# Patient Record
Sex: Male | Born: 2000 | Hispanic: Yes | Marital: Single | Smoking: Never smoker
Health system: Southern US, Community
[De-identification: ages and names within clinical notes are randomized; demographics above are authoritative.]

## PROBLEM LIST (undated history)

## (undated) DIAGNOSIS — T783XXA Angioneurotic edema, initial encounter: Secondary | ICD-10-CM

## (undated) DIAGNOSIS — L309 Dermatitis, unspecified: Secondary | ICD-10-CM

## (undated) DIAGNOSIS — E669 Obesity, unspecified: Secondary | ICD-10-CM

## (undated) HISTORY — DX: Dermatitis, unspecified: L30.9

## (undated) HISTORY — DX: Angioneurotic edema, initial encounter: T78.3XXA

## (undated) HISTORY — DX: Obesity, unspecified: E66.9

---
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
## 2021-06-06 ENCOUNTER — Other Ambulatory Visit: Payer: Self-pay

## 2021-06-06 ENCOUNTER — Encounter: Payer: Self-pay | Admitting: Pediatrics

## 2021-06-06 ENCOUNTER — Other Ambulatory Visit (HOSPITAL_COMMUNITY)
Admission: RE | Admit: 2021-06-06 | Discharge: 2021-06-06 | Disposition: A | Discharge status: Home / Self Care | Payer: Medicaid Other | Source: Ambulatory Visit | Attending: Pediatrics | Admitting: Pediatrics

## 2021-06-06 ENCOUNTER — Ambulatory Visit (INDEPENDENT_AMBULATORY_CARE_PROVIDER_SITE_OTHER): Payer: Medicaid Other | Admitting: Pediatrics

## 2021-06-06 VITALS — BP 118/64 | Ht 69.92 in | Wt 260.0 lb

## 2021-06-06 DIAGNOSIS — R7309 Other abnormal glucose: Secondary | ICD-10-CM | POA: Diagnosis not present

## 2021-06-06 DIAGNOSIS — Z113 Encounter for screening for infections with a predominantly sexual mode of transmission: Secondary | ICD-10-CM

## 2021-06-06 DIAGNOSIS — E785 Hyperlipidemia, unspecified: Secondary | ICD-10-CM | POA: Diagnosis not present

## 2021-06-06 DIAGNOSIS — Z6837 Body mass index (BMI) 37.0-37.9, adult: Secondary | ICD-10-CM | POA: Diagnosis not present

## 2021-06-06 DIAGNOSIS — Z0000 Encounter for general adult medical examination without abnormal findings: Secondary | ICD-10-CM | POA: Diagnosis not present

## 2021-06-06 DIAGNOSIS — Z68.41 Body mass index (BMI) pediatric, greater than or equal to 95th percentile for age: Secondary | ICD-10-CM | POA: Diagnosis not present

## 2021-06-06 LAB — POCT RAPID HIV: Rapid HIV, POC: NEGATIVE

## 2021-06-06 NOTE — Progress Notes (Signed)
Adolescent Well Care Visit Daniel Bean is a 20 y.o. male who is here for well care.     PCP:  Daniel Osgood, MD   History was provided by the patient and mother.  Confidentiality was discussed with the patient and, if applicable, with caregiver as well. Patient's personal or confidential phone number:    Current Issues: Current concerns include .   Has cut back on portion sizes  Working Holiday representative now  No ongoing issues with pilonidal cyst  Nutrition: Nutrition/Eating Behaviors: as above - home cooked foods, some fruits and vegetables Adequate calcium in diet?: yes Supplements/ Vitamins: none  Exercise/ Media: Play any Sports?:  none Exercise:   works Holiday representative Screen Time:  < 2 hours Media Rules or Monitoring?: no  Sleep:  Sleep: adequate  Social Screening: Lives with:  parents Parental relations:  good Activities, Work, and Theme park manager Concerns regarding behavior with peers?  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  no Secondhand smoke exposure?  no Drugs/ETOH?  no  Sexually Active?  no   Pregnancy Prevention: none  Safe at home, in school & in relationships?  Yes Safe to self?  Yes   Screenings:  The patient completed the Rapid Assessment for Adolescent Preventive Services screening questionnaire and the following topics were identified as risk factors and discussed: healthy eating and exercise  In addition, the following topics were discussed as part of anticipatory guidance healthy eating, exercise, and social isolation.  PHQ-9 completed and results indicated no concerns  Physical Exam:  Vitals:   06/06/21 1013 06/06/21 1052  BP: 132/76 118/64  Weight: 260 lb (117.9 kg)   Height: 5' 9.92" (1.776 m)    BP 118/64 (BP Location: Right Arm, Patient Position: Sitting)   Ht 5' 9.92" (1.776 m)    Wt 260 lb (117.9 kg)   BMI 37.39 kg/m  Body mass index: body mass index is 37.39 kg/m. Blood pressure percentiles are not available for patients who are 18 years or older.  Hearing Screening  Method: Audiometry   500Hz  1000Hz  2000Hz  4000Hz   Right ear 20 20 20 20   Left ear 20 20 20 20    Vision Screening   Right eye Left eye Both eyes  Without correction 20/20 20/20 20/202  With correction       Physical Exam Vitals and nursing note reviewed.  Constitutional:      General: He is not in acute distress.    Appearance: He is well-developed.  HENT:     Head: Normocephalic.     Right Ear: External ear normal.     Left Ear: External ear normal.     Nose: Nose normal.     Mouth/Throat:     Pharynx: No oropharyngeal exudate.  Eyes:     Conjunctiva/sclera: Conjunctivae normal.     Pupils: Pupils are equal, round, and reactive to light.  Neck:     Thyroid: No thyromegaly.  Cardiovascular:     Rate and Rhythm: Normal rate.     Heart sounds: Normal heart sounds. No murmur heard. Pulmonary:     Effort: Pulmonary effort is normal.     Breath sounds: Normal breath sounds.  Abdominal:     General: Bowel sounds are normal.     Palpations: Abdomen is soft. There is no mass.     Tenderness: There is no abdominal tenderness.  Hernia: There is no hernia in the left inguinal area.  Genitourinary:    Penis: Normal.      Testes: Normal.        Right: Mass not present. Right testis is descended.        Left: Mass not present. Left testis is descended.  Musculoskeletal:        General: Normal range of motion.     Cervical back: Normal range of motion and neck supple.  Lymphadenopathy:     Cervical: No cervical adenopathy.  Skin:    General: Skin is warm and dry.     Findings: No rash.  Neurological:     Mental Status: He is alert and oriented to person, place, and time.     Cranial Nerves: No cranial nerve deficit.     Assessment and Plan:   1. Encounter for general adult  medical examination without abnormal findings Now 20 years old and needs to transfer to adult care - Ambulatory referral to [facility]  2. Screening examination for venereal disease - POCT Rapid HIV  3. Routine screening for STI (sexually transmitted infection) - Urine cytology ancillary only  4. Body mass index, pediatric, greater than or equal to 95th percentile for age Stable BMI from last year - congratulated on positive chagnes made.  Blood pressure normal on my check today  Healthy habits reviewed.  Will repeat labs today to trend from last year - Hemoglobin A1c - Comprehensive metabolic panel - Lipid panel   BMI is not appropriate for age  Hearing screening result:normal Vision screening result: normal  Counseling provided for all of the vaccine components  Orders Placed This Encounter  Procedures   POCT Rapid HIV     No follow-ups on file.Dory Peru, MD

## 2021-06-07 LAB — COMPREHENSIVE METABOLIC PANEL
AG Ratio: 1.6 (calc) (ref 1.0–2.5)
ALT: 16 U/L (ref 8–46)
AST: 15 U/L (ref 12–32)
Albumin: 4.7 g/dL (ref 3.6–5.1)
Alkaline phosphatase (APISO): 52 U/L (ref 46–169)
BUN: 12 mg/dL (ref 7–20)
CO2: 26 mmol/L (ref 20–32)
Calcium: 9.8 mg/dL (ref 8.9–10.4)
Chloride: 103 mmol/L (ref 98–110)
Creat: 0.67 mg/dL (ref 0.60–1.24)
Globulin: 2.9 g/dL (calc) (ref 2.1–3.5)
Glucose, Bld: 84 mg/dL (ref 65–99)
Potassium: 4.2 mmol/L (ref 3.8–5.1)
Sodium: 138 mmol/L (ref 135–146)
Total Bilirubin: 1.3 mg/dL — ABNORMAL HIGH (ref 0.2–1.1)
Total Protein: 7.6 g/dL (ref 6.3–8.2)

## 2021-06-07 LAB — HEMOGLOBIN A1C
Hgb A1c MFr Bld: 4.9 % of total Hgb (ref <5.7)
Mean Plasma Glucose: 94 mg/dL
eAG (mmol/L): 5.2 mmol/L

## 2021-06-07 LAB — LIPID PANEL
Cholesterol: 185 mg/dL — ABNORMAL HIGH (ref <170)
HDL: 42 mg/dL — ABNORMAL LOW (ref >45)
LDL Cholesterol (Calc): 121 mg/dL (calc) — ABNORMAL HIGH (ref <110)
Non-HDL Cholesterol (Calc): 143 mg/dL (calc) — ABNORMAL HIGH (ref <120)
Total CHOL/HDL Ratio: 4.4 (calc) (ref <5.0)
Triglycerides: 109 mg/dL — ABNORMAL HIGH (ref <90)

## 2021-06-07 LAB — URINE CYTOLOGY ANCILLARY ONLY
Chlamydia: NEGATIVE
Comment: NEGATIVE
Comment: NORMAL
Neisseria Gonorrhea: NEGATIVE

## 2021-07-02 ENCOUNTER — Other Ambulatory Visit: Payer: Self-pay

## 2021-07-02 ENCOUNTER — Encounter (HOSPITAL_COMMUNITY): Payer: Self-pay | Admitting: Emergency Medicine

## 2021-07-02 ENCOUNTER — Ambulatory Visit (HOSPITAL_COMMUNITY)
Admission: EM | Admit: 2021-07-02 | Discharge: 2021-07-02 | Disposition: A | Discharge status: Home / Self Care | Payer: Medicaid Other | Attending: Student | Admitting: Student

## 2021-07-02 DIAGNOSIS — M5440 Lumbago with sciatica, unspecified side: Secondary | ICD-10-CM

## 2021-07-02 DIAGNOSIS — M5441 Lumbago with sciatica, right side: Secondary | ICD-10-CM

## 2021-07-02 MED ORDER — PREDNISONE 20 MG PO TABS: 40.0000 mg | ORAL_TABLET | Freq: Every day | ORAL | 0 refills | Status: AC

## 2021-07-02 MED ORDER — TIZANIDINE HCL 2 MG PO TABS: 2.0000 mg | ORAL_TABLET | Freq: Three times a day (TID) | ORAL | 0 refills | Status: DC | PRN

## 2021-07-02 NOTE — Discharge Instructions (Addendum)
-  Prednisone, 2 pills taken at the same time for 5 days in a row.  Try taking this earlier in the day as it can give you energy. Avoid NSAIDs like ibuprofen and alleve while taking this medication as they can increase your risk of stomach upset and even GI bleeding when in combination with a steroid. You can continue tylenol (acetaminophen) up to 1000mg 3x daily. -Start the muscle relaxer-Zanaflex (tizanidine), up to 3 times daily for muscle spasms and pain.  This can make you drowsy, so take at bedtime or when you do not need to drive or operate machinery. -Heating pad, rest 

## 2021-07-02 NOTE — ED Provider Notes (Signed)
[facility]    CSN: [id number] Arrival date & time: 07/02/21  0941      History   Chief Complaint Chief Complaint  Patient presents with   Back Pain   Leg Pain    Right     HPI Daniel Bean is a 20 y.o. male presenting with back pain and sciatica.  Medical history noncontributory.  Here today with mom.  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  Pain started following this.  Pain is right sided with pain shooting down the back of his leg with flexion of the spine. Denies numbness in arms/legs, denies weakness in arms/legs, denies saddle anesthesia, denies bowel/bladder incontinence, denies urinary retention, denies constipation.   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 difficile enteritis 07/27/2015   Eczema of both hands 03/21/2015   Obesity, unspecified 10/20/2013    History reviewed. No pertinent surgical history.     Home Medications    Prior to Admission medications   Medication Sig Start Date End Date Taking? Authorizing Provider  predniSONE (DELTASONE) 20 MG tablet Take 2 tablets (40 mg total) by mouth daily for 5 days. Take with breakfast or lunch. Avoid NSAIDs (ibuprofen, etc) while taking this medication. 07/02/21 07/07/21 Yes Rhys Martini, PA-C  tiZANidine (ZANAFLEX) 2 MG tablet Take 1 tablet (2 mg total) by mouth every 8 (eight) hours as needed for muscle spasms. 07/02/21  Yes Rhys Martini, PA-C    Family History Family History  Problem Relation Age of Onset    Allergic rhinitis Neg Hx    Angioedema Neg Hx    Asthma Neg Hx    Eczema Neg Hx    Immunodeficiency Neg Hx    Urticaria Neg Hx     Social History Social History   Tobacco Use   Smoking status: Never   Smokeless tobacco: Never  Substance Use Topics   Alcohol use: No   Drug use: No     Allergies   Patient has no known allergies.   Review of Systems Review of Systems  Constitutional:  Negative for chills, fever and unexpected weight change.  Respiratory:  Negative for chest tightness and shortness of breath.   Cardiovascular:  Negative for chest pain and palpitations.  Gastrointestinal:  Negative for abdominal pain, diarrhea, nausea and vomiting.  Genitourinary:  Negative for decreased urine volume, difficulty urinating and frequency.  Musculoskeletal:  Positive for back pain. Negative for arthralgias, gait problem, joint swelling, myalgias, neck pain and neck stiffness.  Skin:  Negative for wound.  Neurological:  Negative for dizziness, tremors, seizures, syncope, facial asymmetry, speech difficulty, weakness, light-headedness, numbness and headaches.  All other systems reviewed and are negative.   Physical Exam Triage Vital Signs ED Triage Vitals  Enc Vitals Group     BP 07/02/21 1100 136/79     Pulse Rate 07/02/21 1100 94     Resp 07/02/21 1100 18     Temp 07/02/21 1100 98.3 F (36.8 C)     Temp  src --      SpO2 07/02/21 1100 99 %     Weight --      Height --      Head Circumference --      Peak Flow --      Pain Score 07/02/21 1058 10     Pain Loc --      Pain Edu? --      Excl. in GC? --    No data found.  Updated Vital Signs BP 136/79   Pulse 94   Temp 98.3 F (36.8 C)   Resp 18   SpO2 99%   Visual Acuity Right Eye Distance:   Left Eye Distance:   Bilateral Distance:    Right Eye Near:   Left Eye Near:    Bilateral Near:     Physical Exam Vitals reviewed.  Constitutional:      General: He is not in acute distress.    Appearance:  Normal appearance. He is not ill-appearing.  HENT:     Head: Normocephalic and atraumatic.  Cardiovascular:     Rate and Rhythm: Normal rate and regular rhythm.     Heart sounds: Normal heart sounds.  Pulmonary:     Effort: Pulmonary effort is normal.     Breath sounds: Normal breath sounds and air entry.  Abdominal:     Tenderness: There is no abdominal tenderness. There is no right CVA tenderness, left CVA tenderness, guarding or rebound.  Musculoskeletal:     Cervical back: Normal range of motion. No swelling, deformity, signs of trauma, rigidity, spasms, tenderness, bony tenderness or crepitus. No pain with movement.     Thoracic back: No swelling, deformity, signs of trauma, spasms, tenderness or bony tenderness. Normal range of motion. No scoliosis.     Lumbar back: No swelling, deformity, signs of trauma, spasms, tenderness or bony tenderness. Normal range of motion. Negative right straight leg raise test and negative left straight leg raise test. No scoliosis.     Comments: Right-sided lumbar paraspinous muscle tenderness to palpation, pain elicited with flexion lumbar spine.  No cervical or thoracic paraspinous muscle tenderness.  Strength and sensation intact upper and lower extremities, no saddle anesthesia.  Gait intact. No midline spinous tenderness, deformity, stepoff.  Absolutely no other injury, deformity, tenderness, ecchymosis, abrasion.  Neurological:     General: No focal deficit present.     Mental Status: He is alert.     Cranial Nerves: No cranial nerve deficit.  Psychiatric:        Mood and Affect: Mood normal.        Behavior: Behavior normal.        Thought Content: Thought content normal.        Judgment: Judgment normal.     UC Treatments / Results  Labs (all labs ordered are listed, but only abnormal results are displayed) Labs Reviewed - No data to display  EKG   Radiology No results found.  Procedures Procedures (including critical care  time)  Medications Ordered in UC Medications - No data to display  Initial Impression / Assessment and Plan / UC Course  I have reviewed the triage vital signs and the nursing notes.  Pertinent labs & imaging results that were available during my care of the patient were reviewed by me and considered in my medical decision making (see chart for details).     This patient is a very pleasant 20 y.o. year old male presenting with lumbar strain with sciatica. No  red flag symptoms.   Trial of zanaflex, prednisone, heat, ROM exercises.   ED return precautions discussed. Patient and Mom verbalizes understanding and agreement.    Final Clinical Impressions(s) / UC Diagnoses   Final diagnoses:  Back pain of lumbar region with sciatica     Discharge Instructions      -Prednisone, 2 pills taken at the same time for 5 days in a row.  Try taking this earlier in the day as it can give you energy. Avoid NSAIDs like ibuprofen and alleve while taking this medication as they can increase your risk of stomach upset and even GI bleeding when in combination with a steroid. You can continue tylenol (acetaminophen) up to 1000mg  3x daily. -Start the muscle relaxer-Zanaflex (tizanidine), up to 3 times daily for muscle spasms and pain.  This can make you drowsy, so take at bedtime or when you do not need to drive or operate machinery. -Heating pad, rest   ED Prescriptions     Medication Sig Dispense Auth. Provider   predniSONE (DELTASONE) 20 MG tablet Take 2 tablets (40 mg total) by mouth daily for 5 days. Take with breakfast or lunch. Avoid NSAIDs (ibuprofen, etc) while taking this medication. 10 tablet , PA-C   tiZANidine (ZANAFLEX) 2 MG tablet Take 1 tablet (2 mg total) by mouth every 8 (eight) hours as needed for muscle spasms. 21 tablet Rhys Martini, PA-C      PDMP not reviewed this encounter.   Rhys Martini, PA-C 07/02/21 1201

## 2021-07-02 NOTE — ED Triage Notes (Signed)
Pt is present today with lower back pain and right leg pain. Pt states the pain is radiating from his lower back down his leg Pt states that the pain started x3 days ago

## 2021-08-08 ENCOUNTER — Ambulatory Visit (HOSPITAL_COMMUNITY)
Admission: EM | Admit: 2021-08-08 | Discharge: 2021-08-08 | Disposition: A | Discharge status: Home / Self Care | Payer: Medicaid Other | Attending: Emergency Medicine | Admitting: Emergency Medicine

## 2021-08-08 ENCOUNTER — Encounter (HOSPITAL_COMMUNITY): Payer: Self-pay | Admitting: Emergency Medicine

## 2021-08-08 ENCOUNTER — Other Ambulatory Visit: Payer: Self-pay

## 2021-08-08 DIAGNOSIS — M5416 Radiculopathy, lumbar region: Secondary | ICD-10-CM | POA: Diagnosis not present

## 2021-08-08 MED ORDER — PREDNISONE 10 MG (21) PO TBPK: ORAL_TABLET | Freq: Every day | ORAL | 0 refills | Status: AC

## 2021-08-08 MED ORDER — METHOCARBAMOL 500 MG PO TABS: 500.0000 mg | ORAL_TABLET | Freq: Every day | ORAL | 0 refills | Status: AC

## 2021-08-08 NOTE — ED Triage Notes (Signed)
Pt is present today with right leg pain. Pt states pain started x7 weeks ago. Pt denies any injury

## 2021-08-08 NOTE — ED Provider Notes (Signed)
[facility]  ____________________________________________  Time seen: Approximately 10:57 AM  I have reviewed the triage vital signs and the nursing notes.   HISTORY  Chief Complaint Leg Pain   Historian Patient     HPI Daniel Bean is a 20 y.o. male presents to the emergency department right lateral leg pain.  Pain radiates from the right side of her low back.  No bowel or bladder incontinence or saddle Anesthesia. patient has been able to stand and ambulate easily.  No fever or chills at home.  No falls or mechanisms of trauma.   Past Medical History:  Diagnosis Date   Angioedema 04/07/2016   Eczema    Obesity      Immunizations up to date:  Yes.     Past Medical History:  Diagnosis Date   Angioedema 04/07/2016   Eczema    Obesity     Patient Active Problem List   Diagnosis Date Noted   Leg pain, posterior, left 05/10/2020   Hypertension 08/18/2018   Pilonidal disease 08/18/2018   Angioedema 04/07/2016   Perennial and seasonal allergic rhinitis 04/07/2016   Seasonal allergic conjunctivitis 04/07/2016   Atopic dermatitis 04/07/2016   Vitamin D deficiency 01/29/2016   Elevated blood pressure reading 01/29/2016   C. difficile enteritis 07/27/2015   Eczema of both hands 03/21/2015   Obesity, unspecified 10/20/2013    History reviewed. No pertinent surgical history.  Prior to Admission medications   Medication Sig Start Date End Date Taking? Authorizing Provider  methocarbamol (ROBAXIN) 500 MG tablet Take 1 tablet (500 mg total) by mouth daily for 7 days. 08/08/21 08/15/21 Yes Pia Mau M, PA-C  predniSONE (STERAPRED UNI-PAK 21 TAB) 10 MG (21) TBPK tablet Take by mouth daily for 6 days. 6,5,4,3,2,1 08/08/21 08/14/21 Yes Pia Mau M, PA-C  tiZANidine (ZANAFLEX) 2 MG tablet Take 1 tablet (2 mg total) by mouth every 8 (eight) hours as needed for muscle spasms. 07/02/21   Rhys Martini, PA-C    Allergies Patient has no known  allergies.  Family History  Problem Relation Age of Onset   Allergic rhinitis Neg Hx    Angioedema Neg Hx    Asthma Neg Hx    Eczema Neg Hx    Immunodeficiency Neg Hx    Urticaria Neg Hx     Social History Social History   Tobacco Use   Smoking status: Never   Smokeless tobacco: Never  Substance Use Topics   Alcohol use: No   Drug use: No     Review of Systems  Constitutional: No fever/chills Eyes:  No discharge ENT: No upper respiratory complaints. Respiratory: no cough. No SOB/ use of accessory muscles to breath Gastrointestinal:   No nausea, no vomiting.  No diarrhea.  No constipation. Musculoskeletal: Patient has right leg pain.  Skin: Negative for rash, abrasions, lacerations, ecchymosis.    ____________________________________________   PHYSICAL EXAM:  VITAL SIGNS: ED Triage Vitals  Enc Vitals Group     BP 08/08/21 1033 125/75     Pulse Rate 08/08/21 1033 88     Resp 08/08/21 1033 18     Temp 08/08/21 1033 98.1 F (36.7 C)     Temp src --      SpO2 08/08/21 1033 98 %     Weight --      Height --      Head Circumference --      Peak Flow --      Pain Score 08/08/21 1032 7  Pain Loc --      Pain Edu? --      Excl. in GC? --      Constitutional: Alert and oriented. Well appearing and in no acute distress. Eyes: Conjunctivae are normal. PERRL. EOMI. Head: Atraumatic. ENT: Cardiovascular: Normal rate, regular rhythm. Normal S1 and S2.  Good peripheral circulation. Respiratory: Normal respiratory effort without tachypnea or retractions. Lungs CTAB. Good air entry to the bases with no decreased or absent breath sounds Gastrointestinal: Bowel sounds x 4 quadrants. Soft and nontender to palpation. No guarding or rigidity. No distention. Musculoskeletal: Full range of motion to all extremities. No obvious deformities noted.  Positive straight leg raise on the right. Neurologic:  Normal for age. No gross focal neurologic deficits are appreciated.   Skin:  Skin is warm, dry and intact. No rash noted. Psychiatric: Mood and affect are normal for age. Speech and behavior are normal.   ____________________________________________   LABS (all labs ordered are listed, but only abnormal results are displayed)  Labs Reviewed - No data to display ____________________________________________  EKG   ____________________________________________  RADIOLOGY   No results found.  ____________________________________________    PROCEDURES  Procedure(s) performed:     Procedures     Medications - No data to display   ____________________________________________   INITIAL IMPRESSION / ASSESSMENT AND PLAN / ED COURSE  Pertinent labs & imaging results that were available during my care of the patient were reviewed by me and considered in my medical decision making (see chart for details).      Assessment and plan Lumbar radiculopathy 20 year old male presents to the urgent care with paresthesias of the right lower extremity concerning for lumbar radiculopathy.  Patient was able to ambulate easily.  Physical exam.  We will start patient on tapered prednisone and Robaxin at night before bed.  Return precautions were given to return with new or worsening symptoms.   ____________________________________________  FINAL CLINICAL IMPRESSION(S) / ED DIAGNOSES  Final diagnoses:  Lumbar radiculopathy      NEW MEDICATIONS STARTED DURING THIS VISIT:  ED Discharge Orders          Ordered    predniSONE (STERAPRED UNI-PAK 21 TAB) 10 MG (21) TBPK tablet  Daily        08/08/21 1055    methocarbamol (ROBAXIN) 500 MG tablet  Daily        08/08/21 1055                This chart was dictated using voice recognition software/Dragon. Despite best efforts to proofread, errors can occur which can change the meaning. Any change was purely unintentional.     Orvil Feil, PA-C 08/08/21 1100

## 2021-08-08 NOTE — Discharge Instructions (Addendum)
Take tapered prednisone as directed. Take Robaxin once nightly before bed.

## 2021-09-11 ENCOUNTER — Ambulatory Visit (INDEPENDENT_AMBULATORY_CARE_PROVIDER_SITE_OTHER): Payer: Medicaid Other | Admitting: Pediatrics

## 2021-09-11 ENCOUNTER — Ambulatory Visit (HOSPITAL_COMMUNITY)
Admission: EM | Admit: 2021-09-11 | Discharge: 2021-09-11 | Discharge status: Against Medical Advice | Payer: Medicaid Other

## 2021-09-11 ENCOUNTER — Encounter: Payer: Self-pay | Admitting: Pediatrics

## 2021-09-11 ENCOUNTER — Other Ambulatory Visit: Payer: Self-pay

## 2021-09-11 VITALS — Ht 69.76 in | Wt 261.2 lb

## 2021-09-11 DIAGNOSIS — M5416 Radiculopathy, lumbar region: Secondary | ICD-10-CM

## 2021-09-11 MED ORDER — METHOCARBAMOL 500 MG PO TABS: 500.0000 mg | ORAL_TABLET | Freq: Every day | ORAL | 0 refills | Status: AC

## 2021-09-11 NOTE — Progress Notes (Signed)
PCP: Pcp, No   Chief Complaint  Patient presents with   SAME DAY    LEG PAIN FOR 3-4 MONTHS; RT LEG.        Subjective:  HPI:  Daniel Bean is a 20 y.o. male presenting today for ongoing right sided leg pain. The pain has been constant for the last 13 weeks. It is sharp and sometimes feels numb. Occasionally feels like pins and needles. Right sided weakness. It starts in his hip and radiates to his ankle. Muscle relaxers are the only thing that have helped the pain. Tylenol, Motrin, Icy hot did not help. Sitting and laying down makes it worse. Increased activity makes it worse. Standing up makes it better unless he has to stand for long periods, then it worsens. Improves if he leans forward. Endorses some lower back pain that radiates to his hip. He first noticed the pain after lifting a heavy object. No longer lifting heavy objects. Has been using muscle pain creams and OTC cramp control medicine. The pain is similar to the pain in his left leg when he injured his hamstring last year, only it never improved.   Denies numbness in arms, denies weakness in arms, denies saddle anesthesia, denies bowel/bladder incontinence, denies urinary retention, denies constipation.   REVIEW OF SYSTEMS:  All others negative except otherwise noted above in HPI.    Meds: Current Outpatient Medications  Medication Sig Dispense Refill   methocarbamol (ROBAXIN) 500 MG tablet Take 1 tablet (500 mg total) by mouth daily. 30 tablet 0   tiZANidine (ZANAFLEX) 2 MG tablet Take 1 tablet (2 mg total) by mouth every 8 (eight) hours as needed for muscle spasms. 21 tablet 0   No current facility-administered medications for this visit.    ALLERGIES: No Known Allergies  PMH:  Past Medical History:  Diagnosis Date   Angioedema 04/07/2016   Eczema    Obesity     PSH: History reviewed. No pertinent surgical history.  Social history:  Social History   Social History Narrative   Lives with parents. 11th  grade at [employer]    Family history: Family History  Problem Relation Age of Onset   Allergic rhinitis Neg Hx    Angioedema Neg Hx    Asthma Neg Hx    Eczema Neg Hx    Immunodeficiency Neg Hx    Urticaria Neg Hx      Objective:   Physical Examination:  BP:   (Blood pressure percentiles are not available for patients who are 18 years or older.)  Wt: 261 lb 3.2 oz (118.5 kg)  Ht: 5' 9.76" (1.772 m)  BMI: Body mass index is 37.73 kg/m. (>99 %ile (Z= 2.47) based on CDC (Boys, 2-20 Years) BMI-for-age based on BMI available as of 06/06/2021 from contact on 06/06/2021.) GENERAL: Well appearing, no distress, sitting comfortably on exam table HEENT: NCAT, clear sclerae, no nasal discharge, no tonsillary erythema or exudate, MMM NECK: Supple, no cervical LAD LUNGS: EWOB, CTAB, no wheeze, no crackles CARDIO: RRR, normal S1S2 no murmur, well perfused ABDOMEN: Normoactive bowel sounds, soft, ND/NT, no masses or organomegaly EXTREMITIES: Warm and well perfused, no deformity MSK: full active and passive ROM bilateral upper and lower extremities, 5/5 strength bilateral UE/LE, pain with extension of right lower extremity, pain with hip flexion, pain with right external rotation of hip. NEURO: Awake, alert, interactive, normal strength, tone, and gait. Decreased sensation right leg compared to left leg.  SKIN: No rash, ecchymosis or petechiae  Assessment/Plan:   Daniel Bean is a 20 y.o. old male here for ongoing right lower extremity pain x 13 weeks. He has been seen at urgent care multiple times with normal RLE xray and short course of steroid and muscle relaxant. Muscle relaxer is the only thing that has helped with the pain. He has pain through multiple range of motions of his RLE, although has FROM active and passive. Strength 5/5. Exam and history consistent with right lumbar radiculopathy. Given chronic nature of his pain without improvement, will refer to orthopedics for further evaluation and  potential imaging. Will give short course of Robaxin for pain management in the interim.   1. Lumbar radiculopathy, right - Supportive care discussed  - Ambulatory referral to Orthopedics - methocarbamol (ROBAXIN) 500 MG tablet; Take 1 tablet (500 mg total) by mouth daily.  Dispense: 30 tablet; Refill: 0  - Reasons to seek emergency care discussed   Follow up: Return if symptoms worsen or fail to improve.

## 2021-09-16 ENCOUNTER — Ambulatory Visit (INDEPENDENT_AMBULATORY_CARE_PROVIDER_SITE_OTHER): Payer: Medicaid Other | Admitting: Physician Assistant

## 2021-09-16 ENCOUNTER — Encounter: Payer: Self-pay | Admitting: Physician Assistant

## 2021-09-16 ENCOUNTER — Ambulatory Visit (INDEPENDENT_AMBULATORY_CARE_PROVIDER_SITE_OTHER): Payer: Medicaid Other

## 2021-09-16 DIAGNOSIS — M545 Low back pain, unspecified: Secondary | ICD-10-CM

## 2021-09-16 DIAGNOSIS — G8929 Other chronic pain: Secondary | ICD-10-CM

## 2021-09-16 NOTE — Progress Notes (Signed)
Office Visit Note   Patient: Daniel Bean           Date of Birth: 06-17-2001           MRN: [id number] Visit Date: 09/16/2021              Requested by: Marijo File, MD [address] [place],  [place] [address] PCP: Pcp, No  Chief Complaint  Patient presents with   Lower Back - Pain      HPI: Patient is a pleasant 20 year old gentleman with a 75-month history of lower back pain radiating down into his right buttock and leg with associated tingling and numbness.  He denies any loss of bowel or bladder control.  He relates this to possibly an injury of lifting about 25 pounds in July.  He has had physical therapy which is helped but he admits he has not continued with the exercises.  He also had a steroid taper which seemed to help.  Denies any fever or chills.  Denies any weakness  Assessment & Plan: Visit Diagnoses:  1. Chronic right-sided low back pain, unspecified whether sciatica present     Plan: Findings consistent with a lumbar radiculopathy.  X-rays are reassuring.  Given this is been going on for 6 months and has had both physical therapy and a steroid taper with the return of symptoms I recommended an MRI.  He is asking if he can try more physical therapy as well and is I placed an order for this as well  Follow-Up Instructions: No follow-ups on file.   Ortho Exam  Patient is alert, oriented, no adenopathy, well-dressed, normal affect, normal respiratory effort. Examination of his lower back he has no step-offs no specific spot of pain.  He is dictates his pain pattern is wrapping around his buttock going down his leg.  Strength is 5 out of 5 negative straight leg raise deep tendon reflexes are intact and equal  Imaging: XR Lumbar Spine 2-3 Views  Result Date: 09/16/2021 2 view lumbar spine demonstrates well-maintained spaces between the vertebrae.  Lordotic curve is without any listhesis and is without any scoliosis.  No acute osseous changes.   No images are attached to the encounter.  Labs: Lab Results  Component Value Date   HGBA1C 4.9 06/06/2021   HGBA1C 5.1 05/02/2020   HGBA1C 5.1 09/09/2018   ESRSEDRATE 5 10/12/2015   ESRSEDRATE 19 (H) 07/26/2015   CRP <0.5 10/12/2015   CRP 16.7 (H) 07/26/2015   CRP 14.8 (H) 07/24/2015   REPTSTATUS 07/30/2015 FINAL 07/26/2015   REPTSTATUS 07/27/2015 FINAL 07/26/2015   CULT  07/26/2015    NO SALMONELLA, SHIGELLA, CAMPYLOBACTER, YERSINIA, OR E.COLI 0157:H7 ISOLATED Performed at [facility] Results  Component Value Date   ALBUMIN 4.6 10/12/2015   ALBUMIN 2.6 (L) 07/25/2015   ALBUMIN 3.6 07/24/2015    No results found for: MG Lab Results  Component Value Date   VD25OH 24 (L) 04/03/2016   VD25OH 13 (L) 05/16/2015    No results found for: PREALBUMIN CBC EXTENDED Latest Ref Rng & Units 04/07/2016 10/12/2015 07/26/2015  WBC 4.5 - 13.0 K/uL 9.8 9.0 9.7  RBC 4.10 - 5.70 MIL/uL 4.80 4.85 3.60(L)  HGB 12.0 - 16.9 g/dL 58.5 14.8(H) 10.9(L)  HCT 36.0 - 49.0 % 43.0 44.1(H) 32.1(L)  PLT 140 - 400 K/uL 279 322 152  NEUTROABS 1,800 - 8,000 cells/uL 5,978 4.7 6.6  LYMPHSABS 1,200 - 5,200 cells/uL  3,136 3.6 1.9     There is no height or weight on file to calculate BMI.  Orders:  Orders Placed This Encounter  Procedures   XR Lumbar Spine 2-3 Views   MR Lumbar Spine w/o contrast   Ambulatory referral to Physical Therapy   No orders of the defined types were placed in this encounter.    Procedures: No procedures performed  Clinical Data: No additional findings.  ROS:  All other systems negative, except as noted in the HPI. Review of Systems  Objective: Vital Signs: There were no vitals taken for this visit.  Specialty Comments:  No specialty comments available.  PMFS History: Patient Active Problem List   Diagnosis Date Noted   Leg pain, posterior, left 05/10/2020   Hypertension 08/18/2018   Pilonidal disease 08/18/2018   Angioedema  04/07/2016   Perennial and seasonal allergic rhinitis 04/07/2016   Seasonal allergic conjunctivitis 04/07/2016   Atopic dermatitis 04/07/2016   Vitamin D deficiency 01/29/2016   Elevated blood pressure reading 01/29/2016   C. difficile enteritis 07/27/2015   Eczema of both hands 03/21/2015   Obesity, unspecified 10/20/2013   Past Medical History:  Diagnosis Date   Angioedema 04/07/2016   Eczema    Obesity     Family History  Problem Relation Age of Onset   Allergic rhinitis Neg Hx    Angioedema Neg Hx    Asthma Neg Hx    Eczema Neg Hx    Immunodeficiency Neg Hx    Urticaria Neg Hx     History reviewed. No pertinent surgical history. Social History   Occupational History   Not on file  Tobacco Use   Smoking status: Never   Smokeless tobacco: Never  Substance and Sexual Activity   Alcohol use: No   Drug use: No   Sexual activity: Never

## 2021-10-07 ENCOUNTER — Other Ambulatory Visit: Payer: Self-pay

## 2021-10-07 ENCOUNTER — Ambulatory Visit: Payer: Medicaid Other | Attending: Physician Assistant

## 2021-10-07 DIAGNOSIS — M545 Low back pain, unspecified: Secondary | ICD-10-CM | POA: Diagnosis not present

## 2021-10-07 DIAGNOSIS — M5126 Other intervertebral disc displacement, lumbar region: Secondary | ICD-10-CM | POA: Diagnosis present

## 2021-10-07 DIAGNOSIS — M5416 Radiculopathy, lumbar region: Secondary | ICD-10-CM | POA: Diagnosis present

## 2021-10-07 DIAGNOSIS — M6281 Muscle weakness (generalized): Secondary | ICD-10-CM | POA: Insufficient documentation

## 2021-10-07 DIAGNOSIS — G8929 Other chronic pain: Secondary | ICD-10-CM | POA: Insufficient documentation

## 2021-10-07 NOTE — Therapy (Addendum)
OUTPATIENT PHYSICAL THERAPY THORACOLUMBAR EVALUATION   Patient Name: Daniel Bean MRN: [id number] DOB:07/06/2001, 21 y.o., male Today's Date: 10/07/2021   PT End of Session - 10/07/21 1451     Visit Number 1    Number of Visits 4    Date for PT Re-Evaluation 11/04/21    Authorization Type MCD    PT Start Time 1445    PT Stop Time 1530    PT Time Calculation (min) 45 min    Activity Tolerance Patient tolerated treatment well    Behavior [facility] Therapy [facility] for tasks assessed/performed             Past Medical History:  Diagnosis Date   Angioedema 04/07/2016   Eczema    Obesity    History reviewed. No pertinent surgical history. Patient Active Problem List   Diagnosis Date Noted   Leg pain, posterior, left 05/10/2020   Hypertension 08/18/2018   Pilonidal disease 08/18/2018   Angioedema 04/07/2016   Perennial and seasonal allergic rhinitis 04/07/2016   Seasonal allergic conjunctivitis 04/07/2016   Atopic dermatitis 04/07/2016   Vitamin D deficiency 01/29/2016   Elevated blood pressure reading 01/29/2016   C. difficile enteritis 07/27/2015   Eczema of both hands 03/21/2015   Obesity, unspecified 10/20/2013    PCP: Pcp, No  REFERRING PROVIDER: Persons, [place], PA  REFERRING DIAG: R sided low back pain  THERAPY DIAG:  Muscle weakness (generalized)  Radiculopathy, lumbar region  Herniated lumbar intervertebral disc  ONSET DATE: July 2022  SUBJECTIVE:                                                                                                                                                                                           SUBJECTIVE STATEMENT: Reporting R side low back and radicular symptoms following lift incident in July 2022, has an MRI scheduled on 10/12/21, has had trial of steroidal dose back with some relief  PERTINENT HISTORY: Patient is a pleasant 21 year old gentleman with a 2176-month history of lower back pain radiating down into his  right buttock and leg with associated tingling and numbness.  He denies any loss of bowel or bladder control.  He relates this to possibly an injury of lifting about 25 pounds in July.  He has had physical therapy which is helped but he admits he has not continued with the exercises.  He also had a steroid taper which seemed to help.  Denies any fever or chills.  Denies any weakness    PAIN:  Are you having pain? Yes VAS scale: 8/10 Pain location: R hip Pain orientation: Right  PAIN TYPE: aching and burning  Pain description: intermittent  Aggravating factors: position changes Relieving factors: undetermined  PRECAUTIONS: None  WEIGHT BEARING RESTRICTIONS No  FALLS:  Has patient fallen in last 6 months? No, Number of falls: 0  LIVING ENVIRONMENT: Lives with: lives with their family Lives in: House/apartment   OCCUPATION: student  PLOF: Independent  PATIENT GOALS: Decrease and manage pain   OBJECTIVE:   DIAGNOSTIC FINDINGS:  MRI scheduled 10/12/21; Plan: Findings consistent with a lumbar radiculopathy.  X-rays are reassuring.  Given this is been going on for 6 months and has had both physical therapy and a steroid taper with the return of symptoms I recommended an MRI.  He is asking if he can try more physical therapy as well and is I placed an order for this as well   PATIENT SURVEYS:  MCD  SCREENING FOR RED FLAGS: Bowel or bladder incontinence: No   COGNITION:  Overall cognitive status: Within functional limits for tasks assessed     SENSATION:  Light touch: Appears intact   MUSCLE LENGTH: Hamstrings: Right 45 deg; Left 45 deg  POSTURE:  L lateral shift  PALPATION: unremarkable  LUMBARAROM/PROM  A/PROM A/PROM  10/07/2021  Flexion 33%  Extension 50%  Right lateral flexion 25%  Left lateral flexion 75%  Right rotation   Left rotation    (Blank rows = not tested)  LE AROM/PROM: BLE ROM WFL  A/PROM Right 10/07/2021 Left 10/07/2021  Hip flexion    Hip  extension    Hip abduction    Hip adduction    Hip internal rotation    Hip external rotation    Knee flexion    Knee extension    Ankle dorsiflexion    Ankle plantarflexion    Ankle inversion    Ankle eversion     (Blank rows = not tested)  LE MMT:  MMT Right 10/07/2021 Left 10/07/2021  Hip flexion    Hip extension    Hip abduction    Hip adduction    Hip internal rotation    Hip external rotation    Knee flexion    Knee extension    Ankle dorsiflexion 5 5  Ankle plantarflexion 5 5  Ankle inversion    Ankle eversion     (Blank rows = not tested)  LUMBAR SPECIAL TESTS:  Straight leg raise test: Positive and Slump test: Positive  FUNCTIONAL TESTS:  Not tested  GAIT: Distance walked: 200 Assistive device utilized: None Level of assistance: Complete Independence     TODAY'S TREATMENT  HEP and assessment    PATIENT EDUCATION:  Education details: Discussed eval findings, rehab rationale and POC and patient is in agreement  Person educated: Patient Education method: Handouts Education comprehension: verbalized understanding, returned demonstration, and needs further education   HOME EXERCISE PROGRAM: Access Code: [email or web address] URL: [email or web address] Date: 10/07/2021 Prepared by: Gustavus Bryant  Exercises Prone Press Up - 5 x daily - 7 x weekly - 1 sets - 10 reps Standing Lumbar Extension - 5 x daily - 7 x weekly - 1 sets - 10 reps   ASSESSMENT:  CLINICAL IMPRESSION: Patient is a 21 y.o. male who was seen today for physical therapy evaluation and treatment for R low back pain and radiculopathy.  He presents with a mild L lateral shift in standing and lumbar ROM mainly limited in flexion. Symptom reproduction with slump test and R SLR, symptoms centralized with prone pres and again with OP applied by PT. Objective impairments include Abnormal gait, decreased activity tolerance, decreased  knowledge of condition, decreased ROM, impaired  flexibility, and pain. These impairments are limiting patient from community activity, driving, and fitness . Personal factors including Age, Fitness, and Time since onset of injury/illness/exacerbation are also affecting patient's functional outcome. Patient will benefit from skilled PT to address above impairments and improve overall function.  REHAB POTENTIAL: Good  CLINICAL DECISION MAKING: Stable/uncomplicated  EVALUATION COMPLEXITY: Low   GOALS: Goals reviewed with patient? Yes  SHORT TERM GOALS:  STG Name Target Date Goal status  1 Patient to demonstrate independence in HEP Baseline: Access Code: [email or web address] 10/21/2021 INITIAL  LONG TERM GOALS:   LTG Name Target Date Goal status  1 Decrease pain to 2/10 Baseline: 8/10 11/04/2021 INITIAL  2 Increase AROM lumbar spine to 75% all planes Baseline: A/PROM A/PROM  10/07/2021  Flexion 33%  Extension 50%  Right lateral flexion 25%  Left lateral flexion 75%  Right rotation   Left rotation    11/04/2021 INITIAL  3 Patient to demo negative slump and SLR on R Baseline: positive signs on R 11/04/2021 INITIAL   PLAN: PT FREQUENCY: 1x/week  PT DURATION: 4 weeks  PLANNED INTERVENTIONS: Therapeutic exercises, Therapeutic activity, Neuro Muscular re-education, Balance training, Gait training, Patient/Family education, Joint mobilization, and Manual therapy  PLAN FOR NEXT SESSION: Review prone press and extension, hamstring flexibility, core strengthening   Hildred Laser PT 10/07/2021, 4:40 PM   Check all possible CPT codes: 30160- Therapeutic Exercise, [phone]- Neuro Re-education, [phone] - Gait Training, 97140 - Manual Therapy, 97530 - Therapeutic Activities, and 97535 - Self Care

## 2021-10-09 ENCOUNTER — Ambulatory Visit (INDEPENDENT_AMBULATORY_CARE_PROVIDER_SITE_OTHER): Payer: Medicaid Other | Admitting: Primary Care

## 2021-10-12 ENCOUNTER — Other Ambulatory Visit: Payer: Self-pay

## 2021-10-12 ENCOUNTER — Ambulatory Visit
Admission: RE | Admit: 2021-10-12 | Discharge: 2021-10-12 | Disposition: A | Discharge status: Home / Self Care | Payer: Medicaid Other | Source: Ambulatory Visit | Attending: Physician Assistant | Admitting: Physician Assistant

## 2021-10-12 DIAGNOSIS — G8929 Other chronic pain: Secondary | ICD-10-CM

## 2021-10-12 DIAGNOSIS — M5416 Radiculopathy, lumbar region: Secondary | ICD-10-CM | POA: Diagnosis not present

## 2021-10-12 DIAGNOSIS — M48061 Spinal stenosis, lumbar region without neurogenic claudication: Secondary | ICD-10-CM | POA: Diagnosis not present

## 2021-10-12 DIAGNOSIS — M5127 Other intervertebral disc displacement, lumbosacral region: Secondary | ICD-10-CM | POA: Diagnosis not present

## 2021-10-12 DIAGNOSIS — R29898 Other symptoms and signs involving the musculoskeletal system: Secondary | ICD-10-CM | POA: Diagnosis not present

## 2021-10-12 DIAGNOSIS — M545 Low back pain, unspecified: Secondary | ICD-10-CM | POA: Diagnosis not present

## 2021-10-16 ENCOUNTER — Encounter: Payer: Self-pay | Admitting: Orthopaedic Surgery

## 2021-10-16 ENCOUNTER — Ambulatory Visit (INDEPENDENT_AMBULATORY_CARE_PROVIDER_SITE_OTHER): Payer: Medicaid Other | Admitting: Orthopaedic Surgery

## 2021-10-16 DIAGNOSIS — G8929 Other chronic pain: Secondary | ICD-10-CM | POA: Diagnosis not present

## 2021-10-16 DIAGNOSIS — M5441 Lumbago with sciatica, right side: Secondary | ICD-10-CM | POA: Diagnosis not present

## 2021-10-16 DIAGNOSIS — M545 Low back pain, unspecified: Secondary | ICD-10-CM | POA: Insufficient documentation

## 2021-10-16 NOTE — Progress Notes (Signed)
Office Visit Note   Patient: Daniel Bean           Date of Birth: [birth year]           MRN: [id number] Visit Date: 10/16/2021              Requested by: No referring provider defined for this encounter. PCP: Pcp, No   Assessment & Plan: Visit Diagnoses:  1. Chronic right-sided low back pain, unspecified whether sciatica present   2. Chronic right-sided low back pain with right-sided sciatica     Plan: 21 year old young man with a chronic history of low back pain associated with right lower extremity radiculopathy.  Has been seen in the urgent care facility 3 times since October for his pain and once in the office approximately a month ago.  In addition of the back pain he has been experiencing right lower extremity numbness and tingling.  An MRI scan was obtained demonstrating moderate central canal stenosis with right greater than left subarticular and lateral recess narrowing at L4-5 secondary to a large right with disc protrusion.  There was mild foraminal narrowing bilaterally at L4-5.  A more shallow central disc protrusion at L5-S1 with potential contact of the transversing L1 nerve root bilaterally.  He does feel like he is improving a little with therapy but I think it is worth considering an epidural steroid injection given the size of the disc protrusion at L4-5.  He relates that in further history he was evaluated when living in [place] for similar problem and had a cortisone injection that "helps".  Neurologically appears to be intact.  But still has a positive straight leg raise on the right  Follow-Up Instructions: Return in about 1 month (around 11/16/2021).   Orders:  Orders Placed This Encounter  Procedures   Ambulatory referral to Physical Medicine Rehab   No orders of the defined types were placed in this encounter.     Procedures: No procedures performed   Clinical Data: No additional findings.   Subjective: Chief Complaint  Patient presents with    Lower Back - Follow-up    MRI review  Patient presents today for follow up on his lower back. He had an MRI and is here today for those results. Has been seen in the urgent care 3 times between October and December 2022 and seen by [facility] in the office on 19 December.  Is been going to physical therapy and feels like he is improving but still having pain in his back and his right lower extremity.  He denies any bowel or bladder dysfunction or or weakness of either lower extremity.  With further questioning he relates he had a similar problem in his back when living in [place] and had a back injection that helped  HPI  Review of Systems   Objective: Vital Signs: There were no vitals taken for this visit.  Physical Exam Constitutional:      Appearance: He is well-developed.  Eyes:     Pupils: Pupils are equal, round, and reactive to light.  Pulmonary:     Effort: Pulmonary effort is normal.  Skin:    General: Skin is warm and dry.  Neurological:     Mental Status: He is alert and oriented to person, place, and time.  Psychiatric:        Behavior: Behavior normal.    Ortho Exam awake alert and oriented x3 comfortable sitting.  Straight leg raise is positive on the right for leg and  back pain but negative on the left.  Motor and sensory exam appeared to be intact.  Painless range of motion of both hips  Specialty Comments:  No specialty comments available.  Imaging: No results found.   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 difficile enteritis 07/27/2015   Eczema of both hands 03/21/2015    Obesity, unspecified 10/20/2013   Past Medical History:  Diagnosis Date   Angioedema 04/07/2016   Eczema    Obesity     Family History  Problem Relation Age of Onset   Allergic rhinitis Neg Hx    Angioedema Neg Hx    Asthma Neg Hx    Eczema Neg Hx    Immunodeficiency Neg Hx    Urticaria Neg Hx     History reviewed. No pertinent surgical history. Social History   Occupational History   Not on file  Tobacco Use   Smoking status: Never   Smokeless tobacco: Never  Substance and Sexual Activity   Alcohol use: No   Drug use: No   Sexual activity: Never

## 2021-10-18 ENCOUNTER — Other Ambulatory Visit: Payer: Self-pay

## 2021-10-18 ENCOUNTER — Ambulatory Visit: Payer: Medicaid Other

## 2021-10-18 DIAGNOSIS — M6281 Muscle weakness (generalized): Secondary | ICD-10-CM | POA: Diagnosis not present

## 2021-10-18 DIAGNOSIS — M5126 Other intervertebral disc displacement, lumbar region: Secondary | ICD-10-CM

## 2021-10-18 DIAGNOSIS — M5416 Radiculopathy, lumbar region: Secondary | ICD-10-CM

## 2021-10-18 NOTE — Therapy (Signed)
OUTPATIENT PHYSICAL THERAPY TREATMENT NOTE   Patient Name: Daniel Bean MRN: [id number] DOB:10/05/2000, 21 y.o., male Today's Date: 10/18/2021  PCP: Oneita HurtPcp, No REFERRING PROVIDER: Persons, [place], [place]   PT End of Session - 10/18/21 0836     Visit Number 2    Number of Visits 4    Date for PT Re-Evaluation 11/04/21    Authorization Type MCD    PT Start Time 0835    PT Stop Time 0915    PT Time Calculation (min) 40 min    Activity Tolerance Patient tolerated treatment well    Behavior [facility] Therapy [facility] for tasks assessed/performed             Past Medical History:  Diagnosis Date   Angioedema 04/07/2016   Eczema    Obesity    History reviewed. No pertinent surgical history. 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 difficile enteritis 07/27/2015   Eczema of both hands 03/21/2015   Obesity, unspecified 10/20/2013    REFERRING DIAG: M54.50,G89.29 (ICD-10-CM) - Chronic right-sided low back pain, unspecified whether sciatica present   THERAPY DIAG:  Muscle weakness (generalized)  Radiculopathy, lumbar region  Herniated lumbar intervertebral disc  PERTINENT HISTORY: Patient is a pleasant 21 year old gentleman with a 4459-month history of lower back pain radiating down into his right buttock and leg with associated tingling and numbness.  He denies any loss of bowel or bladder control.  He relates this to possibly an injury of lifting about 25 pounds in July.  He has had physical therapy which is helped but he admits he has not continued with the exercises.  He also had a steroid taper which seemed to  help.  Denies any fever or chills.  Denies any weakness   PRECAUTIONS: HNP  SUBJECTIVE: RLE symptoms slightly elevated as of this morning  PAIN:  Are you having pain? Yes NPRS scale: 8/10 Pain location: RLE Pain orientation: Right  PAIN TYPE: burning and tingling Pain description: intermittent and stabbing  Aggravating factors: undetermined Relieving factors: position changes       OBJECTIVE:    DIAGNOSTIC FINDINGS:  MRI scheduled 10/12/21; Plan: Findings consistent with a lumbar radiculopathy.  X-rays are reassuring.  Given this is been going on for 6 months and has had both physical therapy and a steroid taper with the return of symptoms I recommended an MRI.  He is asking if he can try more physical therapy as well and is I placed an order for this as well   An MRI scan was obtained demonstrating moderate central canal stenosis with right greater than left subarticular and lateral recess narrowing at L4-5 secondary to a large right with disc protrusion.  There was mild foraminal narrowing bilaterally at L4-5.  A more shallow central disc protrusion at L5-S1 with potential contact of the transversing L1 nerve root bilaterally.   PATIENT SURVEYS:  N/a due to MCD   SCREENING FOR RED FLAGS: Bowel or bladder incontinence: No     COGNITION:          Overall cognitive status: Within functional limits for tasks assessed  SENSATION:          Light touch: Appears intact           MUSCLE LENGTH: Hamstrings: Right 45 deg; Left 45 deg   POSTURE:  L lateral shift   PALPATION: unremarkable   LUMBARAROM/PROM   A/PROM A/PROM  10/07/2021  Flexion 33%  Extension 50%  Right lateral flexion 25%  Left lateral flexion 75%  Right rotation    Left rotation     (Blank rows = not tested)   LE AROM/PROM: BLE ROM WFL   A/PROM Right 10/07/2021 Left 10/07/2021  Hip flexion      Hip extension      Hip abduction      Hip adduction      Hip internal rotation       Hip external rotation      Knee flexion      Knee extension      Ankle dorsiflexion      Ankle plantarflexion      Ankle inversion      Ankle eversion       (Blank rows = not tested)   LE MMT:   MMT Right 10/07/2021 Left 10/07/2021  Hip flexion      Hip extension      Hip abduction      Hip adduction      Hip internal rotation      Hip external rotation      Knee flexion      Knee extension      Ankle dorsiflexion 5 5  Ankle plantarflexion 5 5  Ankle inversion      Ankle eversion       (Blank rows = not tested)   LUMBAR SPECIAL TESTS:  Straight leg raise test: Positive and Slump test: Positive   FUNCTIONAL TESTS:  Not tested   GAIT: Distance walked: 200 Assistive device utilized: None Level of assistance: Complete Independence         TODAY'S TREATMENT  [facility] Adult PT Treatment:                                                DATE: 10/18/21 Therapeutic Exercise: Bridge 2x10(pain) Curl up 2x10 Supine march 2x10 alternating Pelvic tilt 2x10  Therapeutic Activity: Prone press with close monitoring of symptoms and positioning to centralize low back pain, able to centralize pain with prone pressup with hips shifted to L, away from pain Log roll technique to minimixe spinal stresses when transitioning supine to sit  HEP and assessment (evaluation)     PATIENT EDUCATION:  Education details: Discussed eval findings, rehab rationale and POC and patient is in agreement  Person educated: Patient Education method: Handouts Education comprehension: verbalized understanding, returned demonstration, and needs further education     HOME EXERCISE PROGRAM: Access Code: [email or web address] URL: [email or web address] Date: 10/07/2021 Prepared by: Gustavus Bryant   Exercises Prone Press Up - 5 x daily - 7 x weekly - 1 sets - 10 reps Standing Lumbar Extension - 5 x daily - 7 x weekly - 1 sets - 10 reps     ASSESSMENT:   CLINICAL IMPRESSION: Slight increase in RLE  symptoms w/o inciting factor.  Showing centralization of symptoms with prone press and addition of hips L to close down R side.  Patient unable to perform log roll due to pain levels as well  as poor core strength.   REHAB POTENTIAL: Good   CLINICAL DECISION MAKING: Stable/uncomplicated   EVALUATION COMPLEXITY: Low     GOALS: Goals reviewed with patient? Yes   SHORT TERM GOALS:   STG Name Target Date Goal status  1 Patient to demonstrate independence in HEP Baseline: Access Code: [email or web address] 10/21/2021 INITIAL  LONG TERM GOALS:    LTG Name Target Date Goal status  1 Decrease pain to 2/10 Baseline: 8/10 11/04/2021 INITIAL  2 Increase AROM lumbar spine to 75% all planes Baseline: A/PROM A/PROM  10/07/2021  Flexion 33%  Extension 50%  Right lateral flexion 25%  Left lateral flexion 75%  Right rotation    Left rotation     11/04/2021 INITIAL  3 Patient to demo negative slump and SLR on R Baseline: positive signs on R 11/04/2021 INITIAL    PLAN: PT FREQUENCY: 1x/week   PT DURATION: 4 weeks   PLANNED INTERVENTIONS: Therapeutic exercises, Therapeutic activity, Neuro Muscular re-education, Balance training, Gait training, Patient/Family education, Joint mobilization, and Manual therapy   PLAN FOR NEXT SESSION: Review prone press and extension with lateral hip shifting, hamstring flexibility, core strengthening, standing lateral shift        Hildred Laser PT 10/18/2021, 8:38 AM

## 2021-10-23 ENCOUNTER — Telehealth: Payer: Self-pay | Admitting: Physical Medicine and Rehabilitation

## 2021-10-23 NOTE — Telephone Encounter (Signed)
Daniel Bean with [employer] called stating she needs Shena to send clinicals over. Daniel Bean asked that when this is sent over the reference # be attached.   Reference# [id number] Fax# [phone] CB# [phone]

## 2021-10-25 ENCOUNTER — Ambulatory Visit: Payer: Medicaid Other

## 2021-10-25 ENCOUNTER — Other Ambulatory Visit: Payer: Self-pay

## 2021-10-25 DIAGNOSIS — M6281 Muscle weakness (generalized): Secondary | ICD-10-CM | POA: Diagnosis not present

## 2021-10-25 DIAGNOSIS — M5126 Other intervertebral disc displacement, lumbar region: Secondary | ICD-10-CM

## 2021-10-25 DIAGNOSIS — M5416 Radiculopathy, lumbar region: Secondary | ICD-10-CM

## 2021-10-25 NOTE — Therapy (Signed)
OUTPATIENT PHYSICAL THERAPY TREATMENT NOTE   Patient Name: Daniel Bean MRN: [id number] DOB:2001/07/05, 21 y.o., male Today's Date: 10/25/2021  PCP: Oneita Hurt, No REFERRING PROVIDER: Persons, [place], [place]   PT End of Session - 10/25/21 0920     Visit Number 3    Number of Visits 4    Date for PT Re-Evaluation 11/04/21    Authorization Type MCD    PT Start Time 0920    PT Stop Time 1000    PT Time Calculation (min) 40 min    Activity Tolerance Patient tolerated treatment well    Behavior During Therapy [facility] for tasks assessed/performed             Past Medical History:  Diagnosis Date   Angioedema 04/07/2016   Eczema    Obesity    History reviewed. No pertinent surgical history. 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 difficile enteritis 07/27/2015   Eczema of both hands 03/21/2015   Obesity, unspecified 10/20/2013    REFERRING DIAG: M54.50,G89.29 (ICD-10-CM) - Chronic right-sided low back pain, unspecified whether sciatica present   THERAPY DIAG:  Muscle weakness (generalized)  Herniated lumbar intervertebral disc  Radiculopathy, lumbar region  PERTINENT HISTORY: Patient is a pleasant 21 year old gentleman with a 46-month history of lower back pain radiating down into his right buttock and leg with associated tingling and numbness.  He denies any loss of bowel or bladder control.  He relates this to possibly an injury of lifting about 25 pounds in July.  He has had physical therapy which is helped but he admits he has not continued with the exercises.  He also had a steroid taper which seemed to  help.  Denies any fever or chills.  Denies any weakness   PRECAUTIONS: HNP  SUBJECTIVE: Reporting symptoms have eased as of this morning, has been compliant with HEP  PAIN:  Are you having pain? Yes NPRS scale: 8/10 Pain location: RLE Pain orientation: Right  PAIN TYPE: burning and tingling Pain description: intermittent and stabbing  Aggravating factors: undetermined Relieving factors: position changes       OBJECTIVE:    DIAGNOSTIC FINDINGS:  MRI scheduled 10/12/21; Plan: Findings consistent with a lumbar radiculopathy.  X-rays are reassuring.  Given this is been going on for 6 months and has had both physical therapy and a steroid taper with the return of symptoms I recommended an MRI.  He is asking if he can try more physical therapy as well and is I placed an order for this as well   An MRI scan was obtained demonstrating moderate central canal stenosis with right greater than left subarticular and lateral recess narrowing at L4-5 secondary to a large right with disc protrusion.  There was mild foraminal narrowing bilaterally at L4-5.  A more shallow central disc protrusion at L5-S1 with potential contact of the transversing L1 nerve root bilaterally.   PATIENT SURVEYS:  N/a due to MCD   SCREENING FOR RED FLAGS: Bowel or bladder incontinence: No     COGNITION:          Overall cognitive status: Within functional limits for tasks assessed  SENSATION:          Light touch: Appears intact           MUSCLE LENGTH: Hamstrings: Right 45 deg; Left 45 deg   POSTURE:  L lateral shift   PALPATION: unremarkable   LUMBARAROM/PROM   A/PROM A/PROM  10/07/2021  Flexion 33%  Extension 50%  Right lateral flexion 25%  Left lateral flexion 75%  Right rotation    Left rotation     (Blank rows = not tested)   LE AROM/PROM: BLE ROM WFL   A/PROM Right 10/07/2021 Left 10/07/2021  Hip flexion      Hip extension      Hip abduction      Hip adduction       Hip internal rotation      Hip external rotation      Knee flexion      Knee extension      Ankle dorsiflexion      Ankle plantarflexion      Ankle inversion      Ankle eversion       (Blank rows = not tested)   LE MMT:   MMT Right 10/07/2021 Left 10/07/2021  Hip flexion      Hip extension      Hip abduction      Hip adduction      Hip internal rotation      Hip external rotation      Knee flexion      Knee extension      Ankle dorsiflexion 5 5  Ankle plantarflexion 5 5  Ankle inversion      Ankle eversion       (Blank rows = not tested)   LUMBAR SPECIAL TESTS:  Straight leg raise test: Positive and Slump test: Positive   FUNCTIONAL TESTS:  Not tested   GAIT: Distance walked: 200 Assistive device utilized: None Level of assistance: Complete Independence         TODAY'S TREATMENT  [facility] Adult PT Treatment:                                                DATE: 10/25/21 Therapeutic Exercise: Nustep seat 10 L4 x8 min Curl ups 2x15 PPTx10, Ues used for feedback PPT with marching x15 B alternating PPT with al UE flexion Supine nerve floss sciatic 15x B piriformis stretch 30sx3 Prone press with OP and hips L, 3x10 Log roll technique for supine to sit transitions  [facility] Adult PT Treatment:                                                DATE: 10/18/21 Therapeutic Exercise: Bridge 2x10(pain) Curl up 2x10 Supine march 2x10 alternating Pelvic tilt 2x10  Therapeutic Activity: Prone press with close monitoring of symptoms and positioning to centralize low back pain, able to centralize pain with prone pressup with hips shifted to L, away from pain Log roll technique to minimixe spinal stresses when transitioning supine to sit  HEP and assessment (evaluation)     PATIENT EDUCATION:  Education details: Discussed eval findings, rehab rationale and POC and patient is in agreement  Person educated: Patient Education method: Handouts Education comprehension: verbalized  understanding, returned demonstration, and  needs further education     HOME EXERCISE PROGRAM: Access Code: [email or web address] URL: [email or web address] Date: 10/07/2021 Prepared by: Gustavus Bryant   Exercises Prone Press Up - 5 x daily - 7 x weekly - 1 sets - 10 reps Standing Lumbar Extension - 5 x daily - 7 x weekly - 1 sets - 10 reps     ASSESSMENT:   CLINICAL IMPRESSION: Symptoms improved since last session, less RLE pain.  Prone press with OP provides relief however patient needs to log roll more effectively to relieve twisting in low back.  Added more core strengthening today as weakness evident.   REHAB POTENTIAL: Good   CLINICAL DECISION MAKING: Stable/uncomplicated   EVALUATION COMPLEXITY: Low     GOALS: Goals reviewed with patient? Yes   SHORT TERM GOALS:   STG Name Target Date Goal status  1 Patient to demonstrate independence in HEP Baseline: Access Code: [email or web address] 10/21/2021 INITIAL  LONG TERM GOALS:    LTG Name Target Date Goal status  1 Decrease pain to 2/10 Baseline: 8/10 11/04/2021 INITIAL  2 Increase AROM lumbar spine to 75% all planes Baseline: A/PROM A/PROM  10/07/2021  Flexion 33%  Extension 50%  Right lateral flexion 25%  Left lateral flexion 75%  Right rotation    Left rotation     11/04/2021 INITIAL  3 Patient to demo negative slump and SLR on R Baseline: positive signs on R 11/04/2021 INITIAL    PLAN: PT FREQUENCY: 1x/week   PT DURATION: 4 weeks   PLANNED INTERVENTIONS: Therapeutic exercises, Therapeutic activity, Neuro Muscular re-education, Balance training, Gait training, Patient/Family education, Joint mobilization, and Manual therapy   PLAN FOR NEXT SESSION: Continue prone press and extension with lateral hip shifting, hamstring flexibility, core strengthening, standing lateral shift, log roll technique        Farris Has Narvel Kozub PT 10/25/2021, 9:22 AM

## 2021-11-01 ENCOUNTER — Other Ambulatory Visit: Payer: Self-pay

## 2021-11-01 ENCOUNTER — Ambulatory Visit: Payer: Medicaid Other | Attending: Physician Assistant

## 2021-11-01 DIAGNOSIS — M5126 Other intervertebral disc displacement, lumbar region: Secondary | ICD-10-CM | POA: Insufficient documentation

## 2021-11-01 DIAGNOSIS — M5416 Radiculopathy, lumbar region: Secondary | ICD-10-CM | POA: Insufficient documentation

## 2021-11-01 DIAGNOSIS — M6281 Muscle weakness (generalized): Secondary | ICD-10-CM | POA: Insufficient documentation

## 2021-11-01 NOTE — Therapy (Addendum)
OUTPATIENT PHYSICAL THERAPY TREATMENT NOTE/DC Summary   Patient Name: Ovid Witman MRN: [id number] DOB:[birth year], 21 y.o., male Today's Date: 11/01/2021  PCP: Pcp, No REFERRING PROVIDER: Persons, Bevely Palmer, PA PHYSICAL THERAPY DISCHARGE SUMMARY  Visits from Start of Care: 4  Current functional level related to goals / functional outcomes: UTA   Remaining deficits: UTA   Education / Equipment: HEP   Patient agrees to discharge. Patient goals were partially met. Patient is being discharged due to did not respond to therapy.    PT End of Session - 11/01/21 1000     Visit Number 4    Number of Visits 5    Date for PT Re-Evaluation 11/04/21    Authorization Type MCD    Progress Note Due on Visit 5    PT Start Time 1000    PT Stop Time 1045    PT Time Calculation (min) 45 min    Activity Tolerance Patient tolerated treatment well    Behavior During Therapy [facility] for tasks assessed/performed             Past Medical History:  Diagnosis Date   Angioedema 04/07/2016   Eczema    Obesity    History reviewed. No pertinent surgical history. 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 difficile enteritis 07/27/2015   Eczema of both hands 03/21/2015   Obesity, unspecified 10/20/2013    REFERRING DIAG: M54.50,G89.29 (ICD-10-CM) - Chronic right-sided low back pain, unspecified whether sciatica present   THERAPY DIAG:  Muscle weakness (generalized)  Herniated lumbar intervertebral disc  Radiculopathy, lumbar region  PERTINENT HISTORY: Patient is a pleasant 21 year old gentleman with a 59-month history of lower back pain radiating down into his right buttock and leg with associated tingling and numbness.  He denies any loss of bowel or bladder control.  He relates this to possibly an injury of lifting about 25 pounds in July.  He has had physical therapy which is helped but he admits he has not continued with the exercises.  He also had a steroid taper which seemed to help.  Denies any fever or chills.  Denies any weakness   PRECAUTIONS: HNP  SUBJECTIVE: Symptoms returned w/o aggravating factors.  Reports he woke up and back was hurting. Tries to perform exercises to relieve pain when able.  Symptoms localized to low back and thigh but do not extend past knee  PAIN:  Are you having pain? Yes NPRS scale: 8/10 Pain location: RLE Pain orientation: Right  PAIN TYPE: burning and tingling Pain description: intermittent and stabbing  Aggravating factors: sitting Relieving factors: position changes       OBJECTIVE:    DIAGNOSTIC FINDINGS:  MRI scheduled 10/12/21; Plan: Findings consistent with a lumbar radiculopathy.  X-rays are reassuring.  Given this is been going on for 6 months and has had both physical therapy and a steroid taper with the return of symptoms I recommended an MRI.  He is asking if he can try more physical therapy as well and is I placed an order for this as well   An MRI scan was obtained demonstrating moderate central canal stenosis with right  greater than left subarticular and lateral recess narrowing at L4-5 secondary to a large right with disc protrusion.  There was mild foraminal narrowing bilaterally at L4-5.  A more shallow central disc protrusion at L5-S1 with potential contact of the transversing L1 nerve root bilaterally.   PATIENT SURVEYS:  N/a due to MCD   SCREENING FOR RED FLAGS: Bowel or bladder incontinence: No     COGNITION:          Overall cognitive status: Within functional limits for tasks assessed                        SENSATION:           Light touch: Appears intact           MUSCLE LENGTH: Hamstrings: Right 45 deg; Left 45 deg   POSTURE:  L lateral shift   PALPATION: unremarkable   LUMBARAROM/PROM   A/PROM A/PROM  10/07/2021  Flexion 33%  Extension 50%  Right lateral flexion 25%  Left lateral flexion 75%  Right rotation    Left rotation     (Blank rows = not tested)   LE AROM/PROM: BLE ROM WFL   A/PROM Right 10/07/2021 Left 10/07/2021  Hip flexion      Hip extension      Hip abduction      Hip adduction      Hip internal rotation      Hip external rotation      Knee flexion      Knee extension      Ankle dorsiflexion      Ankle plantarflexion      Ankle inversion      Ankle eversion       (Blank rows = not tested)   LE MMT:   MMT Right 10/07/2021 Left 10/07/2021  Hip flexion      Hip extension      Hip abduction      Hip adduction      Hip internal rotation      Hip external rotation      Knee flexion      Knee extension      Ankle dorsiflexion 5 5  Ankle plantarflexion 5 5  Ankle inversion      Ankle eversion       (Blank rows = not tested)   LUMBAR SPECIAL TESTS:  Straight leg raise test: Positive and Slump test: Positive   FUNCTIONAL TESTS:  Not tested   GAIT: Distance walked: 200 Assistive device utilized: None Level of assistance: Complete Independence         TODAY'S TREATMENT  [facility] Adult PT Treatment:                                                DATE: 11/01/21 Therapeutic Exercise: Treadmill 0.9 mph x 8 min Curl ups 2x15 SL open book 15x L side only, too painful to perform to R in L SL Clams 30x B PPT with marching x15 B alternating PPT with alt. UE flexion 15x B Bridge 10x, 20x with ball squeeze Supine nerve floss sciatic 15x, restricted and painful on R, ankle pain elicited B piriformis stretch 30s x3 Prone press with OP and hips L, 3x10 Lateral shift correction for R lateral shift  [facility] Adult PT Treatment:  DATE:  10/25/21 Therapeutic Exercise: Nustep seat 10 L4 x8 min Curl ups 2x15 PPTx10, Ues used for feedback PPT with marching x15 B alternating PPT with al UE flexion Supine nerve floss sciatic 15x B piriformis stretch 30sx3 Prone press with OP and hips L, 3x10 Log roll technique for supine to sit transitions  [facility] Adult PT Treatment:                                                DATE: 10/18/21 Therapeutic Exercise: Bridge 2x10(pain) Curl up 2x10 Supine march 2x10 alternating Pelvic tilt 2x10  Therapeutic Activity: Prone press with close monitoring of symptoms and positioning to centralize low back pain, able to centralize pain with prone pressup with hips shifted to L, away from pain Log roll technique to minimixe spinal stresses when transitioning supine to sit  HEP and assessment (evaluation)     PATIENT EDUCATION:  Education details: Discussed eval findings, rehab rationale and POC and patient is in agreement  Person educated: Patient Education method: Handouts Education comprehension: verbalized understanding, returned demonstration, and needs further education     HOME EXERCISE PROGRAM: Access Code: [email or web address] URL: [email or web address] Date: 10/07/2021 Prepared by: Sharlynn Oliphant   Exercises Prone Press Up - 5 x daily - 7 x weekly - 1 sets - 10 reps Standing Lumbar Extension - 5 x daily - 7 x weekly - 1 sets - 10 reps     ASSESSMENT:   CLINICAL IMPRESSION: Patient arrives for treatment showing a prominent L lateral shift and pain when arising from sitting needing UE support.  Continued difficulty with rolling and position changes.  Able to centralize symptoms but unable to maintain centralization and reverts to lateral shift once weightbearing.  Recommend pause PT until patient receives spinal injections in 2 weeks.   REHAB POTENTIAL: Good   CLINICAL DECISION MAKING: Stable/uncomplicated   EVALUATION COMPLEXITY: Low     GOALS: Goals reviewed with  patient? Yes   SHORT TERM GOALS:   STG Name Target Date Goal status  1 Patient to demonstrate independence in HEP Baseline: Access Code: [email or web address] 10/21/2021 11/01/21 Patient can demo HEP back to PT  LONG TERM GOALS:    LTG Name Target Date Goal status  1 Decrease pain to 2/10 Baseline: 8/10 11/11/2021 INITIAL  2 Increase AROM lumbar spine to 75% all planes Baseline: A/PROM A/PROM  10/07/2021  Flexion 33%  Extension 50%  Right lateral flexion 25%  Left lateral flexion 75%  Right rotation    Left rotation     11/11/2021 INITIAL  3 Patient to demo negative slump and SLR on R Baseline: positive signs on R 11/11/2021 INITIAL    PLAN: PT FREQUENCY: 1x/week   PT DURATION: 4 weeks   PLANNED INTERVENTIONS: Therapeutic exercises, Therapeutic activity, Neuro Muscular re-education, Balance training, Gait training, Patient/Family education, Joint mobilization, and Manual therapy   PLAN FOR NEXT SESSION: Continue prone press and extension with lateral hip shifting, hamstring flexibility, core strengthening, standing lateral shift, log roll technique, follow up on benefit of injections        Jacqulynn Cadet Elven Laboy PT 11/01/2021, 10:04 AM

## 2021-11-08 ENCOUNTER — Ambulatory Visit: Payer: Medicaid Other

## 2021-11-14 ENCOUNTER — Ambulatory Visit: Payer: Self-pay

## 2021-11-14 ENCOUNTER — Ambulatory Visit (INDEPENDENT_AMBULATORY_CARE_PROVIDER_SITE_OTHER): Payer: Medicaid Other | Admitting: Physical Medicine and Rehabilitation

## 2021-11-14 ENCOUNTER — Other Ambulatory Visit: Payer: Self-pay

## 2021-11-14 ENCOUNTER — Encounter: Payer: Self-pay | Admitting: Physical Medicine and Rehabilitation

## 2021-11-14 VITALS — BP 131/77 | HR 101

## 2021-11-14 DIAGNOSIS — M5416 Radiculopathy, lumbar region: Secondary | ICD-10-CM

## 2021-11-14 MED ORDER — METHYLPREDNISOLONE ACETATE 80 MG/ML IJ SUSP
80.0000 mg | Freq: Once | INTRAMUSCULAR | Status: AC
  Administered 2021-11-14: 80 mg

## 2021-11-14 NOTE — Patient Instructions (Signed)

## 2021-11-14 NOTE — Progress Notes (Signed)
Pt state lower back pain that travels down his right leg. Pt state walking, standing and bending makes the pain worse. Pt state he just deal with his pain.  Numeric Pain Rating Scale and Functional Assessment Average Pain 5   In the last MONTH (on 0-10 scale) has pain interfered with the following?  1. General activity like being  able to carry out your everyday physical activities such as walking, climbing stairs, carrying groceries, or moving a chair?  Rating(10)   +Driver, -BT, -Dye Allergies.

## 2021-11-24 NOTE — Procedures (Signed)
Lumbar Epidural Steroid Injection - Interlaminar Approach with Fluoroscopic Guidance  Patient: Daniel Bean      Date of Birth: Feb 22, 2001 MRN: [id number] PCP: Pcp, No      Visit Date: 11/14/2021   Universal Protocol:     Consent Given By: the patient  Position: PRONE  Additional Comments: Vital signs were monitored before and after the procedure. Patient was prepped and draped in the usual sterile fashion. The correct patient, procedure, and site was verified.   Injection Procedure Details:   Procedure diagnoses: Lumbar radiculopathy [M54.16]   Meds Administered:  Meds ordered this encounter  Medications   methylPREDNISolone acetate (DEPO-MEDROL) injection 80 mg     Laterality: Right  Location/Site:  L5-S1  Needle: 3.5 in., 20 ga. Tuohy  Needle Placement: Paramedian epidural  Findings:   -Comments: Excellent flow of contrast into the epidural space.  Procedure Details: Using a paramedian approach from the side mentioned above, the region overlying the inferior lamina was localized under fluoroscopic visualization and the soft tissues overlying this structure were infiltrated with 4 ml. of 1% Lidocaine without Epinephrine. The Tuohy needle was inserted into the epidural space using a paramedian approach.   The epidural space was localized using loss of resistance along with counter oblique bi-planar fluoroscopic views.  After negative aspirate for air, blood, and CSF, a 2 ml. volume of Isovue-250 was injected into the epidural space and the flow of contrast was observed. Radiographs were obtained for documentation purposes.    The injectate was administered into the level noted above.   Additional Comments:  The patient tolerated the procedure well Dressing: 2 x 2 sterile gauze and Band-Aid    Post-procedure details: Patient was observed during the procedure. Post-procedure instructions were reviewed.  Patient left the clinic in stable condition.

## 2021-11-24 NOTE — Progress Notes (Signed)
Daniel Bean - 21 y.o. male MRN [id number]  Date of birth: 09-Mar-2001  Office Visit Note: Visit Date: 11/14/2021 PCP: Pcp, No Referred by: Valeria Batman, MD  Subjective: Chief Complaint  Patient presents with   Lower Back - Pain   Right Leg - Pain   HPI:  Daniel Bean is a 21 y.o. male who comes in today at the request of Dr. Norlene Campbell for planned Right L5-S1 Lumbar Interlaminar epidural steroid injection with fluoroscopic guidance.  The patient has failed conservative care including home exercise, medications, time and activity modification.  This injection will be diagnostic and hopefully therapeutic.  Please see requesting physician notes for further details and justification.   ROS Otherwise per HPI.  Assessment & Plan: Visit Diagnoses:    ICD-10-CM   1. Lumbar radiculopathy  M54.16 XR C-ARM NO REPORT    Epidural Steroid injection    methylPREDNISolone acetate (DEPO-MEDROL) injection 80 mg      Plan: No additional findings.   Meds & Orders:  Meds ordered this encounter  Medications   methylPREDNISolone acetate (DEPO-MEDROL) injection 80 mg    Orders Placed This Encounter  Procedures   XR C-ARM NO REPORT   Epidural Steroid injection    Follow-up: Return if symptoms worsen or fail to improve.   Procedures: No procedures performed  Lumbar Epidural Steroid Injection - Interlaminar Approach with Fluoroscopic Guidance  Patient: Daniel Bean      Date of Birth: 23-Aug-2001 MRN: [id number] PCP: Pcp, No      Visit Date: 11/14/2021   Universal Protocol:     Consent Given By: the patient  Position: PRONE  Additional Comments: Vital signs were monitored before and after the procedure. Patient was prepped and draped in the usual sterile fashion. The correct patient, procedure, and site was verified.   Injection Procedure Details:   Procedure diagnoses: Lumbar radiculopathy [M54.16]   Meds Administered:  Meds ordered this encounter   Medications   methylPREDNISolone acetate (DEPO-MEDROL) injection 80 mg     Laterality: Right  Location/Site:  L5-S1  Needle: 3.5 in., 20 ga. Tuohy  Needle Placement: Paramedian epidural  Findings:   -Comments: Excellent flow of contrast into the epidural space.  Procedure Details: Using a paramedian approach from the side mentioned above, the region overlying the inferior lamina was localized under fluoroscopic visualization and the soft tissues overlying this structure were infiltrated with 4 ml. of 1% Lidocaine without Epinephrine. The Tuohy needle was inserted into the epidural space using a paramedian approach.   The epidural space was localized using loss of resistance along with counter oblique bi-planar fluoroscopic views.  After negative aspirate for air, blood, and CSF, a 2 ml. volume of Isovue-250 was injected into the epidural space and the flow of contrast was observed. Radiographs were obtained for documentation purposes.    The injectate was administered into the level noted above.   Additional Comments:  The patient tolerated the procedure well Dressing: 2 x 2 sterile gauze and Band-Aid    Post-procedure details: Patient was observed during the procedure. Post-procedure instructions were reviewed.  Patient left the clinic in stable condition.   Clinical History: MRI LUMBAR SPINE WITHOUT CONTRAST   TECHNIQUE: Multiplanar, multisequence MR imaging of the lumbar spine was performed. No intravenous contrast was administered.   COMPARISON:  Lumbar spine radiographs 09/16/2021   FINDINGS: Segmentation: 5 non rib-bearing lumbar type vertebral bodies are present. The lowest fully formed vertebral body is L5.   Alignment:  No significant listhesis  is present.   Vertebrae:  Marrow signal and vertebral body heights are normal.   Conus medullaris and cauda equina: Conus extends to the L1 level. Conus and cauda equina appear normal.   Paraspinal and other  soft tissues: Limited imaging the abdomen is unremarkable. There is no significant adenopathy. No solid organ lesions are present.   Disc levels:   L1-2: Negative.   L2-3: Negative.   L3-4: Negative.   L4-5: A rightward disc protrusion is present. This leads to moderate central canal stenosis with right greater than left subarticular and lateral recess narrowing. Mild foraminal narrowing is present bilaterally.   L5-S1: A more shallow central disc protrusion is present. The protrusion potentially contacts the traversing L1 nerve roots bilaterally. The foramina are patent.   IMPRESSION: 1. Moderate central canal stenosis with right greater than left subarticular and lateral recess narrowing at L4-5 secondary to a large rightward disc protrusion. 2. Mild foraminal narrowing bilaterally at L4-5. 3. More shallow central disc protrusion at L5-S1 with potential contact of the traversing L1 nerve roots bilaterally.     Electronically Signed   By: Marin Roberts M.D.   On: 10/13/2021 08:02     Objective:  VS:  HT:     WT:    BMI:      BP:131/77   HR:(!) 101bpm   TEMP: ( )   RESP:  Physical Exam Vitals and nursing note reviewed.  Constitutional:      General: He is not in acute distress.    Appearance: Normal appearance. He is not ill-appearing.  HENT:     Head: Normocephalic and atraumatic.     Right Ear: External ear normal.     Left Ear: External ear normal.     Nose: No congestion.  Eyes:     Extraocular Movements: Extraocular movements intact.  Cardiovascular:     Rate and Rhythm: Normal rate.     Pulses: Normal pulses.  Pulmonary:     Effort: Pulmonary effort is normal. No respiratory distress.  Abdominal:     General: There is no distension.     Palpations: Abdomen is soft.  Musculoskeletal:        General: No tenderness or signs of injury.     Cervical back: Neck supple.     Right lower leg: No edema.     Left lower leg: No edema.     Comments:  Patient has good distal strength without clonus.  Skin:    Findings: No erythema or rash.  Neurological:     General: No focal deficit present.     Mental Status: He is alert and oriented to person, place, and time.     Sensory: No sensory deficit.     Motor: No weakness or abnormal muscle tone.     Coordination: Coordination normal.  Psychiatric:        Mood and Affect: Mood normal.        Behavior: Behavior normal.     Imaging: No results found.

## 2021-11-25 ENCOUNTER — Other Ambulatory Visit: Payer: Self-pay

## 2021-11-25 ENCOUNTER — Ambulatory Visit (INDEPENDENT_AMBULATORY_CARE_PROVIDER_SITE_OTHER): Payer: Medicaid Other | Admitting: Primary Care

## 2021-11-25 ENCOUNTER — Encounter (INDEPENDENT_AMBULATORY_CARE_PROVIDER_SITE_OTHER): Payer: Self-pay | Admitting: Primary Care

## 2021-11-25 VITALS — BP 142/81 | HR 103 | Temp 97.9°F | Ht 69.0 in | Wt 271.4 lb

## 2021-11-25 DIAGNOSIS — Z6841 Body Mass Index (BMI) 40.0 and over, adult: Secondary | ICD-10-CM | POA: Diagnosis not present

## 2021-11-25 DIAGNOSIS — I10 Essential (primary) hypertension: Secondary | ICD-10-CM

## 2021-11-25 DIAGNOSIS — Z7185 Encounter for immunization safety counseling: Secondary | ICD-10-CM | POA: Diagnosis not present

## 2021-11-25 DIAGNOSIS — Z7689 Persons encountering health services in other specified circumstances: Secondary | ICD-10-CM | POA: Diagnosis not present

## 2021-11-25 NOTE — Patient Instructions (Signed)
Preventing Hypertension Hypertension, also called high blood pressure, is when the force of blood pumping through the arteries is too strong. Arteries are blood vessels that carry blood from the heart throughout the body. Often, hypertension does not cause symptoms until blood pressure is very high. It is important to have your blood pressure checked regularly. Diet and lifestyle changes can help you prevent hypertension, and they may make you feel better overall and improve your quality of life. If you already have hypertension, you may control it with diet and lifestyle changes, as well as with medicine. How can this condition affect me? Over time, hypertension can damage the arteries and decrease blood flow to important parts of the body, including the brain, heart, and kidneys. By keeping your blood pressure in a healthy range, you can help prevent complications like heart attack, heart failure, stroke, kidney failure, and vascular dementia. What can increase my risk? Being an older adult. Older people are more often affected. Having family members who have had high blood pressure. Being obese. Being male. Males are more likely to have high blood pressure. Drinking too much alcohol or caffeine. Smoking or using illegal drugs. Taking certain medicines, such as antidepressants, decongestants, birth control pills, and NSAIDs, such as ibuprofen. Having thyroid problems. Having certain tumors. What actions can I take to prevent or manage this condition? Work with your health care provider to make a hypertension prevention plan that works for you. Follow your plan and keep all follow-up visits as told by your health care provider. Diet changes Maintain a healthy diet. This includes: Eating less salt (sodium). Ask your health care provider how much sodium is safe for you to have. The general recommendation is to have less than 1 tsp (2,300 mg) of sodium a day. Do not add salt to your food. Choose  low-sodium options when grocery shopping and eating out. Limiting fats in your diet. You can do this by eating low-fat or fat-free dairy products and by eating less red meat. Eating more fruits, vegetables, and whole grains. Make a goal to eat: 1-2 cups of fresh fruits and vegetables each day. 3-4 servings of whole grains each day. Avoiding foods and beverages that have added sugars. Eating fish that contain healthy fats (omega-3 fatty acids), such as mackerel or salmon. If you need help putting together a healthy eating plan, try the DASH diet. This diet is high in fruits, vegetables, and whole grains. It is low in sodium, red meat, and added sugars. DASH stands for Dietary Approaches to Stop Hypertension. Lifestyle changes Lose weight if you are overweight. Losing just 3?5% of your body weight can help prevent or control hypertension. For example, if your present weight is 200 lb (91 kg), a loss of 3-5% of your weight means losing 6-10 lb (2.7-4.5 kg). Ask your health care provider to help you with a diet and exercise plan to safely lose weight. Other recommendations usually include: Get enough exercise. Do at least 150 minutes of moderate-intensity exercise each week. You could do this in short exercise sessions several times a day, or you could do longer exercise sessions a few times a week. For example, you could take a brisk 10-minute walk or bike ride, 3 times a day, for 5 days a week. Find ways to reduce stress, such as exercising, meditating, listening to music, or taking a yoga class. If you need help reducing stress, ask your health care provider. Do not use any products that contain nicotine or tobacco, such  as cigarettes, e-cigarettes, and chewing tobacco. If you need help quitting, ask your health care provider. Chemicals in tobacco and nicotine products raise your blood pressure each time you use them. If you need help quitting, ask your health care provider. Learn how to check your  blood pressure at home. Make sure that you know your personal target blood pressure, as told by your health care provider. Try to sleep 7-9 hours per night.  Alcohol use Do not drink alcohol if: Your health care provider tells you not to drink. You are pregnant, may be pregnant, or are planning to become pregnant. If you drink alcohol: Limit how much you use to: 0-1 drink a day for women. 0-2 drinks a day for men. Be aware of how much alcohol is in your drink. In the [place] one drink equals one 12 oz bottle of beer (355 mL), one 5 oz glass of wine (148 mL), or one 1 oz glass of hard liquor (44 mL). Medicines In addition to diet and lifestyle changes, your health care provider may recommend medicines to help lower your blood pressure. In general: You may need to try a few different medicines to find what works best for you. You may need to take more than one medicine. Take over-the-counter and prescription medicines only as told by your health care provider. Questions to ask your health care provider What is my blood pressure goal? How can I lower my risk for high blood pressure? How should I monitor my blood pressure at home? Where to find support Your health care provider can help you prevent hypertension and help you keep your blood pressure at a healthy level. Your local hospital or your community may also provide support services and prevention programs. The American Heart Association offers an online support network at supportnetwork.heart.org Where to find more information Learn more about hypertension from: [place], Lung, and [place]: https://wilson-eaton.com/ Centers for Disease Control and Prevention: http://www.wolf.info/ American Academy of Family Physicians: familydoctor.org Learn more about the DASH diet from: [place], Lung, and [place]: https://wilson-eaton.com/ Contact a health care provider if: You think you are having a reaction to medicines you have taken. You  have recurrent headaches or feel dizzy. You have swelling in your ankles. You have trouble with your vision. Get help right away if: You have sudden, severe chest, back, or abdominal pain or discomfort. You have shortness of breath. You have a sudden, severe headache. These symptoms may represent a serious problem that is an emergency. Do not wait to see if the symptoms will go away. Get medical help right away. Call your local emergency services (911 in the [place] Do not drive yourself to the hospital.  Summary Hypertension often does not cause any symptoms until blood pressure is very high. It is important to get your blood pressure checked regularly. Diet and lifestyle changes are important steps in preventing hypertension. By keeping your blood pressure in a healthy range, you may prevent complications like heart attack, heart failure, stroke, and kidney failure. Work with your health care provider to make a hypertension prevention plan that works for you. This information is not intended to replace advice given to you by your health care provider. Make sure you discuss any questions you have with your health care provider. Document Revised: 08/16/2019 Document Reviewed: 08/16/2019 Elsevier Patient Education  2022 [place] [employer] Calories are units of energy. Your body needs a certain number of calories from food to keep going  throughout the day. When you eat or drink more calories than your body needs, your body stores the extra calories mostly as fat. When you eat or drink fewer calories than your body needs, your body burns fat to get the energy it needs. Calorie counting means keeping track of how many calories you eat and drink each day. Calorie counting can be helpful if you need to lose weight. If you eat fewer calories than your body needs, you should lose weight. Ask your health care provider what a healthy weight is for you. For calorie counting to  work, you will need to eat the right number of calories each day to lose a healthy amount of weight per week. A dietitian can help you figure out how many calories you need in a day and will suggest ways to reach your calorie goal. A healthy amount of weight to lose each week is usually 1-2 lb (0.5-0.9 kg). This usually means that your daily calorie intake should be reduced by 500-750 calories. Eating 1,200-1,500 calories a day can help most women lose weight. Eating 1,500-1,800 calories a day can help most men lose weight. What do I need to know about calorie counting? Work with your health care provider or dietitian to determine how many calories you should get each day. To meet your daily calorie goal, you will need to: Find out how many calories are in each food that you would like to eat. Try to do this before you eat. Decide how much of the food you plan to eat. Keep a food log. Do this by writing down what you ate and how many calories it had. To successfully lose weight, it is important to balance calorie counting with a healthy lifestyle that includes regular activity. Where do I find calorie information? The number of calories in a food can be found on a Nutrition Facts label. If a food does not have a Nutrition Facts label, try to look up the calories online or ask your dietitian for help. Remember that calories are listed per serving. If you choose to have more than one serving of a food, you will have to multiply the calories per serving by the number of servings you plan to eat. For example, the label on a package of bread might say that a serving size is 1 slice and that there are 90 calories in a serving. If you eat 1 slice, you will have eaten 90 calories. If you eat 2 slices, you will have eaten 180 calories. How do I keep a food log? After each time that you eat, record the following in your food log as soon as possible: What you ate. Be sure to include toppings, sauces, and other  extras on the food. How much you ate. This can be measured in cups, ounces, or number of items. How many calories were in each food and drink. The total number of calories in the food you ate. Keep your food log near you, such as in a pocket-sized notebook or on an app or website on your mobile phone. Some programs will calculate calories for you and show you how many calories you have left to meet your daily goal. What are some portion-control tips? Know how many calories are in a serving. This will help you know how many servings you can have of a certain food. Use a measuring cup to measure serving sizes. You could also try weighing out portions on a kitchen scale. With time, you  will be able to estimate serving sizes for some foods. Take time to put servings of different foods on your favorite plates or in your favorite bowls and cups so you know what a serving looks like. Try not to eat straight from a food's packaging, such as from a bag or box. Eating straight from the package makes it hard to see how much you are eating and can lead to overeating. Put the amount you would like to eat in a cup or on a plate to make sure you are eating the right portion. Use smaller plates, glasses, and bowls for smaller portions and to prevent overeating. Try not to multitask. For example, avoid watching TV or using your computer while eating. If it is time to eat, sit down at a table and enjoy your food. This will help you recognize when you are full. It will also help you be more mindful of what and how much you are eating. What are tips for following this plan? Reading food labels Check the calorie count compared with the serving size. The serving size may be smaller than what you are used to eating. Check the source of the calories. Try to choose foods that are high in protein, fiber, and vitamins, and low in saturated fat, trans fat, and sodium. Shopping Read nutrition labels while you shop. This will  help you make healthy decisions about which foods to buy. Pay attention to nutrition labels for low-fat or fat-free foods. These foods sometimes have the same number of calories or more calories than the full-fat versions. They also often have added sugar, starch, or salt to make up for flavor that was removed with the fat. Make a grocery list of lower-calorie foods and stick to it. Cooking Try to cook your favorite foods in a healthier way. For example, try baking instead of frying. Use low-fat dairy products. Meal planning Use more fruits and vegetables. One-half of your plate should be fruits and vegetables. Include lean proteins, such as chicken, [place], and fish. Lifestyle Each week, aim to do one of the following: 150 minutes of moderate exercise, such as walking. 75 minutes of vigorous exercise, such as running. General information Know how many calories are in the foods you eat most often. This will help you calculate calorie counts faster. Find a way of tracking calories that works for you. Get creative. Try different apps or programs if writing down calories does not work for you. What foods should I eat?  Eat nutritious foods. It is better to have a nutritious, high-calorie food, such as an avocado, than a food with few nutrients, such as a bag of potato chips. Use your calories on foods and drinks that will fill you up and will not leave you hungry soon after eating. Examples of foods that fill you up are nuts and nut butters, vegetables, lean proteins, and high-fiber foods such as whole grains. High-fiber foods are foods with more than 5 g of fiber per serving. Pay attention to calories in drinks. Low-calorie drinks include water and unsweetened drinks. The items listed above may not be a complete list of foods and beverages you can eat. Contact a dietitian for more information. What foods should I limit? Limit foods or drinks that are not good sources of vitamins, minerals, or  protein or that are high in unhealthy fats. These include: Candy. Other sweets. Sodas, specialty coffee drinks, alcohol, and juice. The items listed above may not be a complete list of foods  and beverages you should avoid. Contact a dietitian for more information. How do I count calories when eating out? Pay attention to portions. Often, portions are much larger when eating out. Try these tips to keep portions smaller: Consider sharing a meal instead of getting your own. If you get your own meal, eat only half of it. Before you start eating, ask for a container and put half of your meal into it. When available, consider ordering smaller portions from the menu instead of full portions. Pay attention to your food and drink choices. Knowing the way food is cooked and what is included with the meal can help you eat fewer calories. If calories are listed on the menu, choose the lower-calorie options. Choose dishes that include vegetables, fruits, whole grains, low-fat dairy products, and lean proteins. Choose items that are boiled, broiled, grilled, or steamed. Avoid items that are buttered, battered, fried, or served with cream sauce. Items labeled as crispy are usually fried, unless stated otherwise. Choose water, low-fat milk, unsweetened iced tea, or other drinks without added sugar. If you want an alcoholic beverage, choose a lower-calorie option, such as a glass of wine or light beer. Ask for dressings, sauces, and syrups on the side. These are usually high in calories, so you should limit the amount you eat. If you want a salad, choose a garden salad and ask for grilled meats. Avoid extra toppings such as bacon, cheese, or fried items. Ask for the dressing on the side, or ask for olive oil and vinegar or lemon to use as dressing. Estimate how many servings of a food you are given. Knowing serving sizes will help you be aware of how much food you are eating at restaurants. Where to find more  information Centers for Disease Control and Prevention: http://www.wolf.info/ [place]. Department of Agriculture: http://www.wilson-mendoza.org/ Summary Calorie counting means keeping track of how many calories you eat and drink each day. If you eat fewer calories than your body needs, you should lose weight. A healthy amount of weight to lose per week is usually 1-2 lb (0.5-0.9 kg). This usually means reducing your daily calorie intake by 500-750 calories. The number of calories in a food can be found on a Nutrition Facts label. If a food does not have a Nutrition Facts label, try to look up the calories online or ask your dietitian for help. Use smaller plates, glasses, and bowls for smaller portions and to prevent overeating. Use your calories on foods and drinks that will fill you up and not leave you hungry shortly after a meal. This information is not intended to replace advice given to you by your health care provider. Make sure you discuss any questions you have with your health care provider. Document Revised: 10/27/2019 Document Reviewed: 10/27/2019 Elsevier Patient Education  2022 [place]. Human Papillomavirus Human papillomavirus (HPV) is a common virus that spreads easily from person to person through skin-to-skin or sexual contact. There are many types of HPV. It often does not cause symptoms. However, depending upon the type, it may sometimes cause warts in the genitals (genital or mucosal HPV), or on the hands or feet (cutaneous or nonmucosal HPV). It is possible to be infected for a long time and pass HPV to others without knowing it. In most cases of HPV, a person will recover from the virus without treatment within 2 years of being infected. However, in some cases, HPV infection can last longer and lead to serious health problems. Certain types of genital  or mucosal HPV are considered high-risk and may cause cancers, including cancer of the lower part of the uterus (cervix), vagina, outer male genital area  (vulva), penis, anus, and rectum as well as cancers of the oral cavity, such as the throat, tongue, and tonsils. What are the causes? HPV is caused by a virus that spreads from person to person through contact. This includes genital HPV, which spreads through oral, vaginal, or anal sex. What increases the risk? You may be more likely to develop this condition if you have or have had: Direct contact with a person with HPV. Unprotected oral, vaginal, or anal sex. Several sex partners. A sex partner who has other sex partners. Another sexually transmitted infection (STI). A weak disease-fighting system (immune system). Areas of damaged or non-intact skin. What are the signs or symptoms? Most people who have HPV do not have any symptoms. If symptoms are present, they may include: Wart-like lesions in the throat (from having oral sex). Warts on the infected skin. Genital warts that may itch, burn, bleed, or be painful during sex. How is this diagnosed? If you have wart-like lumps in the anal area or throat, warts along the soles of your feet or palms of your hand, or if genital warts are present, your health care provider can usually diagnose HPV with a physical exam. Genital warts are easily seen. In females, tests may be used to diagnose genital HPV, including: A Pap test. A Pap test takes a sample of cells from the cervix to check for cancer and HPV infection. An HPV test. This is similar to a Pap test and involves taking a sample of cells from the cervix. It may be done at the same time as a Pap test. Using a scope to view the cervix (colposcopy). This may be done if a pelvic exam or Pap test is abnormal. A sample of tissue may be removed for testing (biopsy) during the colposcopy. Currently, there is no test to detect genital HPV in males. How is this treated? There is no treatment for the virus itself. However, there are treatments for the health problems and symptoms HPV can cause.  Treatment for HPV may include: Medicines in a cream, lotion, liquid, or gel form. These medicines may be injected into the warts, or applied directly to them. Use of a probe to apply extreme cold (cryotherapy) to the warts. Application of an intense beam of light (laser treatment) on the warts. Use of a probe to apply extreme heat (electrocautery) to the warts. Surgery to remove the warts. Your health care provider will monitor you closely after you are treated. HPV can come back and you may need treatment again. Follow these instructions at home: Medicines Take over-the-counter and prescription medicines only as told by your health care provider. This includes creams for itching or irritation. Do not treat genital or anal warts with medicines used for treating hand warts. General instructions Do not touch or scratch the warts. Do not have sex while you are being treated. Do not douche or use tampons during treatment (for women). Tell your sex partner about your infection. He or she may also need to be treated. If you become pregnant, tell your health care provider that you have HPV. Your health care provider will monitor you closely during pregnancy to make sure you and your baby are safe. Keep all follow-up visits. This is important. How is this prevented? Talk with your health care provider about getting an HPV vaccine, which can  prevent some HPV infections and related cancers. It will not work if you already have HPV and is not recommended for pregnant women. You may need 2-3 doses of the vaccine, depending on your age. After treatment, use condoms during sex to prevent future infections. Have only one sex partner. Have a sex partner who does not have other sex partners. Get regular Pap tests as directed by your health care provider. Contact a health care provider if: The treated skin becomes red, swollen, or painful. You have a fever. You feel generally ill. You feel lumps or pimples  in and around your genital or anal area. You develop bleeding of the vagina or the treatment area. You have painful sex. Summary Human papillomavirus (HPV) is a common virus that spreads easily from person to person and ishighly contagious. Many people carrying HPV do not have any symptoms. Many forms of HPV can be prevented with vaccination. There is no treatment for the virus itself. However, there are treatments for the health problems and symptoms HPV can cause. This information is not intended to replace advice given to you by your health care provider. Make sure you discuss any questions you have with your health care provider. Document Revised: 05/01/2020 Document Reviewed: 05/01/2020 Elsevier Patient Education  [place].

## 2021-11-25 NOTE — Progress Notes (Signed)
°  [facility]   Subjective:   Daniel Bean is a 21 y.o. male presents for establish care. Patient is not sexually active(never been) and is heterosexual. Patient has No headache, No chest pain, No abdominal pain - No Nausea, No new weakness tingling or numbness, No Cough -shortness of breath. Past Medical History:  Diagnosis Date   Angioedema 04/07/2016   Eczema    Obesity      No Known Allergies    No current outpatient medications on file prior to visit.   No current facility-administered medications on file prior to visit.     Review of System: Comprehensive ROS Pertinent positive and negative noted in HPI    Objective:  BP (!) 142/81 (BP Location: Right Arm, Patient Position: Sitting, Cuff Size: Large)    Pulse (!) 103    Temp 97.9 F (36.6 C) (Oral)    Ht 5\' 9"  (1.753 m)    Wt 271 lb 6.4 oz (123.1 kg)    SpO2 95%    BMI 40.08 kg/m   Filed Weights   11/25/21 1357  Weight: 271 lb 6.4 oz (123.1 kg)    Physical Exam: General Appearance: Well nourished, obese male in no apparent distress. Eyes: PERRLA, EOMs, conjunctiva no swelling or erythema Sinuses: No Frontal/maxillary tenderness ENT/Mouth: Ext aud canals clear, TMs without erythema, bulging. No erythema, swelling, or exudate on post pharynx.  Tonsils not swollen or erythematous. Hearing normal.  Neck: Supple, thyroid normal.  Respiratory: Respiratory effort normal, BS equal bilaterally without rales, rhonchi, wheezing or stridor.  Cardio: RRR with no MRGs. Brisk peripheral pulses without edema.  Abdomen: Soft, + BS.  Non tender, no guarding, rebound, hernias, masses. Lymphatics: Non tender without lymphadenopathy.  Musculoskeletal: Full ROM, 5/5 strength, normal gait.  Skin: Warm, dry without rashes, lesions, ecchymosis.  Neuro: Cranial nerves intact. Normal muscle tone, no cerebellar symptoms. Sensation intact.  Psych: Awake and oriented X 3, normal affect, Insight and Judgment appropriate.     Assessment:  Daniel Bean was seen today for new patient (initial visit).  Diagnoses and all orders for this visit:  Encounter to establish care Establish care with PCP  HPV vaccine counseling HPV is caused by a virus that spreads from person to person through contact. This includes genital HPV, which spreads through oral, vaginal, or anal sex. Viewed pictures of HPV on males and male. He was ready for the vaccine. Explain if you are sure call and schedule an appt. Also provided information on the AVS  Hypertension, unspecified type BP goal - < 130/80 Explained that having normal blood pressure is the goal and medications are helping to get to goal and maintain normal blood pressure. DIET: Limit salt intake, read nutrition labels to check salt content, limit fried and high fatty foods  Avoid using multisymptom OTC cold preparations that generally contain sudafed which can rise BP. Consult with pharmacist on best cold relief products to use for persons with HTN EXERCISE Discussed incorporating exercise such as walking - 30 minutes most days of the week and can do in 10 minute intervals      This note has been created with Darcel Bayley. Any transcriptional errors are unintentional.   Education officer, environmental, NP 11/25/2021, 1:58 PM

## 2021-11-28 ENCOUNTER — Ambulatory Visit (INDEPENDENT_AMBULATORY_CARE_PROVIDER_SITE_OTHER): Payer: Medicaid Other | Admitting: Physical Medicine and Rehabilitation

## 2021-11-28 ENCOUNTER — Encounter: Payer: Self-pay | Admitting: Physical Medicine and Rehabilitation

## 2021-11-28 ENCOUNTER — Other Ambulatory Visit: Payer: Self-pay

## 2021-11-28 VITALS — BP 127/75 | HR 101

## 2021-11-28 DIAGNOSIS — M5441 Lumbago with sciatica, right side: Secondary | ICD-10-CM | POA: Diagnosis not present

## 2021-11-28 DIAGNOSIS — G8929 Other chronic pain: Secondary | ICD-10-CM

## 2021-11-28 DIAGNOSIS — M5416 Radiculopathy, lumbar region: Secondary | ICD-10-CM | POA: Diagnosis not present

## 2021-11-28 DIAGNOSIS — M5116 Intervertebral disc disorders with radiculopathy, lumbar region: Secondary | ICD-10-CM

## 2021-11-28 NOTE — Progress Notes (Signed)
Pt state lower back pain that travels down his right leg. Pt state walking and standing makes to worse. Pt state he uses ice to help ease his pain. ? ?Numeric Pain Rating Scale and Functional Assessment ?Average Pain 10 ?Pain Right Now 4 ?My pain is intermittent, sharp, stabbing, tingling, and aching ?Pain is worse with: walking, standing, and some activites ?Pain improves with: heat/ice ? ? ?In the last MONTH (on 0-10 scale) has pain interfered with the following? ? ?1. General activity like being  able to carry out your everyday physical activities such as walking, climbing stairs, carrying groceries, or moving a chair?  ?Rating(7) ? ?2. Relation with others like being able to carry out your usual social activities and roles such as  activities at home, at work and in your community. ?Rating(8) ? ?3. Enjoyment of life such that you have  been bothered by emotional problems such as feeling anxious, depressed or irritable?  ?Rating(9) ? ?

## 2021-11-28 NOTE — Progress Notes (Signed)
? ?Daniel Bean - 21 y.o. male MRN [id number]  Date of birth: 2001-04-12 ? ?Office Visit Note: ?Visit Date: 11/28/2021 ?PCP: Pcp, No ?Referred by: No ref. provider found ? ?Subjective: ?Chief Complaint  ?Patient presents with  ? Lower Back - Pain  ? Left Leg - Pain  ? ?HPI: Daniel Bean is a 21 y.o. male who comes in today for evaluation of chronic right sided lower back pain radiating down to ankle. Patient had right L5-S1 interlaminar epidural steroid injection on 11/14/2021 performed by Dr. Tyrell Antonio and reports significant and sustained pain relief at this time.  Patient reports his pain is exacerbated by movement and activity, describes as a sore and sharp sensation, denies pain at this time. Patient reports he continues with home exercise regimen and has recently started walking multiple times a week.  Patient states he does take over-the-counter medications as needed for pain. Patient's recent lumbar MRI exhibits moderate central spinal canal stenosis and right lateral recess narrowing at L4-L5 secondary to a large rightward disc protrusion, there is also shallow central disc protrusion at L5-S1 with possible contact of the transverse S1 nerve roots bilaterally. Patient states his functionality has increased since epidural injection and he is now able to be more physically active. Patient states he is now able to manage his pain at home with over-the-counter medications. Patient denies focal weakness, numbness and tingling. Patient denies recent trauma or falls. ? ?Review of Systems  ?Musculoskeletal:  Negative for back pain.  ?Neurological:  Negative for tingling, sensory change, focal weakness and weakness.  ?All other systems reviewed and are negative. Otherwise per HPI. ? ?Assessment & Plan: ?Visit Diagnoses:  ?  ICD-10-CM   ?1. Lumbar radiculopathy  M54.16   ?  ?2. Intervertebral disc disorders with radiculopathy, lumbar region  M51.16   ?  ?3. Chronic right-sided low back pain with right-sided  sciatica  M54.41   ? G89.29   ?  ?   ?Plan: Findings:  ?Chronic right sided lower back pain down to ankle. Significant and sustained pain relief with recent right L5-S1 interlaminar epidural steroid injection. Patients clinical presentation and exam are consistent with right S1 nerve pattern.  Patient continues with home exercise regimen and use of OTC medications as needed. We feel the next step is continue to monitor patient, he is instructed to let us know if his pain returns or change in nature. We would consider repeating injection if his pain returns. Patient has no questions at this time. No red flag symptoms noted upon exam today.   ? ?Meds & Orders: No orders of the defined types were placed in this encounter. ? No orders of the defined types were placed in this encounter. ?  ?Follow-up: Return if symptoms worsen or fail to improve.  ? ?Procedures: ?No procedures performed  ?   ? ?Clinical History: ?MRI LUMBAR SPINE WITHOUT CONTRAST ?  ?TECHNIQUE: ?Multiplanar, multisequence MR imaging of the lumbar spine was ?performed. No intravenous contrast was administered. ?  ?COMPARISON:  Lumbar spine radiographs 09/16/2021 ?  ?FINDINGS: ?Segmentation: 5 non rib-bearing lumbar type vertebral bodies are ?present. The lowest fully formed vertebral body is L5. ?  ?Alignment:  No significant listhesis is present. ?  ?Vertebrae:  Marrow signal and vertebral body heights are normal. ?  ?Conus medullaris and cauda equina: Conus extends to the L1 level. ?Conus and cauda equina appear normal. ?  ?Paraspinal and other soft tissues: Limited imaging the abdomen is ?unremarkable. There is no significant adenopathy. No  solid organ ?lesions are present. ?  ?Disc levels: ?  ?L1-2: Negative. ?  ?L2-3: Negative. ?  ?L3-4: Negative. ?  ?L4-5: A rightward disc protrusion is present. This leads to moderate ?central canal stenosis with right greater than left subarticular and ?lateral recess narrowing. Mild foraminal narrowing is  present ?bilaterally. ?  ?L5-S1: A more shallow central disc protrusion is present. The ?protrusion potentially contacts the traversing L1 nerve roots ?bilaterally. The foramina are patent. ?  ?IMPRESSION: ?1. Moderate central canal stenosis with right greater than left ?subarticular and lateral recess narrowing at L4-5 secondary to a ?large rightward disc protrusion. ?2. Mild foraminal narrowing bilaterally at L4-5. ?3. More shallow central disc protrusion at L5-S1 with potential ?contact of the traversing L1 nerve roots bilaterally. ?  ?  ?Electronically Signed ?  By: Marin Roberts M.D. ?  On: 10/13/2021 08:02  ? ?He reports that he has never smoked. He has never used smokeless tobacco.  ?Recent Labs  ?  06/06/21 ?1101  ?HGBA1C 4.9  ? ? ?Objective:  VS:  HT:    WT:   BMI:     BP:127/75  HR: ?(!) 101bpm  TEMP: ( )  RESP:  ?Physical Exam ?Vitals and nursing note reviewed.  ?HENT:  ?   Head: Normocephalic and atraumatic.  ?   Right Ear: External ear normal.  ?   Left Ear: External ear normal.  ?   Nose: Nose normal.  ?   Mouth/Throat:  ?   Mouth: Mucous membranes are moist.  ?Eyes:  ?   Extraocular Movements: Extraocular movements intact.  ?Cardiovascular:  ?   Rate and Rhythm: Normal rate.  ?   Pulses: Normal pulses.  ?Pulmonary:  ?   Effort: Pulmonary effort is normal.  ?Abdominal:  ?   General: Abdomen is flat. There is no distension.  ?Musculoskeletal:     ?   General: Normal range of motion.  ?   Cervical back: Normal range of motion.  ?   Comments: Pt rises from seated position to standing without difficulty. Good lumbar range of motion. Strong distal strength without clonus, no pain upon palpation of greater trochanters. Sensation intact bilaterally. Walks independently, gait steady.   ?Skin: ?   General: Skin is warm and dry.  ?   Capillary Refill: Capillary refill takes less than 2 seconds.  ?Neurological:  ?   General: No focal deficit present.  ?   Mental Status: He is alert and oriented to  person, place, and time.  ?Psychiatric:     ?   Mood and Affect: Mood normal.     ?   Behavior: Behavior normal.  ?  ?Ortho Exam ? ?Imaging: ?No results found. ? ?Past Medical/Family/Surgical/Social History: ?Medications & Allergies reviewed per EMR, new medications updated. ?Patient Active Problem List  ? Diagnosis Date Noted  ? Low back pain 10/16/2021  ? Leg pain, posterior, left 05/10/2020  ? Hypertension 08/18/2018  ? 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 Angioedema 04/07/2016  ? Perennial and seasonal allergic rhinitis 04/07/2016  ? Seasonal allergic conjunctivitis 04/07/2016  ? Atopic dermatitis 04/07/2016  ? Vitamin D deficiency 01/29/2016  ? Elevated blood pressure reading 01/29/2016  ? C. difficile enteritis 07/27/2015  ? Eczema of both hands 03/21/2015  ? Obesity, unspecified 10/20/2013  ? ?Past Medical History:  ?Diagnosis Date  ? Angioedema 04/07/2016  ? Eczema   ? Obesity   ? ?Family History  ?Problem Relation Age of Onset  ? Allergic rhinitis Neg Hx   ?  Angioedema Neg Hx   ? Asthma Neg Hx   ? Eczema Neg Hx   ? Immunodeficiency Neg Hx   ? Urticaria Neg Hx   ? ?History reviewed. No pertinent surgical history. ?Social History  ? ?Occupational History  ? Not on file  ?Tobacco Use  ? Smoking status: Never  ? Smokeless tobacco: Never  ?Substance and Sexual Activity  ? Alcohol use: No  ? Drug use: No  ? Sexual activity: Never  ? ? ?

## 2021-11-28 NOTE — Patient Instructions (Signed)

## 2021-11-29 NOTE — Progress Notes (Signed)
I participated today with the patient's evaluation and treatment plan along with Megan Williams, FNP °

## 2022-01-22 ENCOUNTER — Ambulatory Visit (INDEPENDENT_AMBULATORY_CARE_PROVIDER_SITE_OTHER): Payer: Medicaid Other | Admitting: Primary Care

## 2022-01-22 ENCOUNTER — Encounter (INDEPENDENT_AMBULATORY_CARE_PROVIDER_SITE_OTHER): Payer: Self-pay | Admitting: Primary Care

## 2022-01-22 VITALS — BP 127/78 | HR 97 | Temp 98.1°F | Ht 69.0 in | Wt 270.8 lb

## 2022-01-22 DIAGNOSIS — Z0130 Encounter for examination of blood pressure without abnormal findings: Secondary | ICD-10-CM | POA: Diagnosis not present

## 2022-01-22 DIAGNOSIS — R21 Rash and other nonspecific skin eruption: Secondary | ICD-10-CM

## 2022-01-22 MED ORDER — LORATADINE 10 MG PO TABS: 10.0000 mg | ORAL_TABLET | Freq: Every day | ORAL | 1 refills | Status: AC

## 2022-01-22 MED ORDER — TRIAMCINOLONE ACETONIDE 0.1 % EX CREA: 1.0000 "application " | TOPICAL_CREAM | Freq: Two times a day (BID) | CUTANEOUS | 1 refills | Status: AC

## 2022-01-22 NOTE — Progress Notes (Signed)
?  [facility] ? ?Daniel Bean, is a 21 y.o. male ? ?DUK:[id number] ? ?[id number] ? ?DOB - 2001-08-23 ? ?Chief Complaint  ?Patient presents with  ? Follow-up  ?  Weight/Bp  ?    ? ?Subjective:  ? ?Daniel Bean is a 21 y.o. male here today for a follow up visit for elevated Bp previous visit. He is also concern with a nodules and rashes that come and go. Denies any changes in deodorant, detergent, foods or soaps.  Patient has No headache, No chest pain, No abdominal pain - No Nausea, No new weakness tingling or numbness, No Cough - shortness of breath ? ?No problems updated. ? ?No Known Allergies ? ?Past Medical History:  ?Diagnosis Date  ? Angioedema 04/07/2016  ? Eczema   ? Obesity   ? ? ?No current outpatient medications on file prior to visit.  ? ?No current facility-administered medications on file prior to visit.  ? ? ?Objective:  ? ?Vitals:  ? 01/22/22 1056  ?BP: 127/78  ?Pulse: 97  ?Temp: 98.1 ?F (36.7 ?C)  ?TempSrc: Oral  ?SpO2: 96%  ?Weight: 270 lb 12.8 oz (122.8 kg)  ?Height: 5\' 9"  (1.753 m)  ? ? ?Exam ?General appearance : Awake, alert, not in any distress. Obese maleSpeech Clear. Not toxic looking ?HEENT: Atraumatic and Normocephalic, pupils equally reactive to light and accomodation ?Neck: Supple, no JVD. No cervical lymphadenopathy.  ?Chest: Good air entry bilaterally, no added sounds  ?CVS: S1 S2 regular, no murmurs.  ?Abdomen: Bowel sounds present, Non tender and not distended with no gaurding, rigidity or rebound. ?Extremities: B/L Lower Ext shows no edema, both legs are warm to touch ?Neurology: Awake alert, and oriented X 3, Non focal ?Skin: No Rash ? ?Data Review ?Lab Results  ?Component Value Date  ? HGBA1C 4.9 06/06/2021  ? HGBA1C 5.1 05/02/2020  ? HGBA1C 5.1 09/09/2018  ? ? ?Assessment & Plan  ? ?1. Rash and nonspecific skin eruption ? ? ? ? ?- triamcinolone cream (KENALOG) 0.1 %; Apply 1 application. topically 2 (two) times daily.  Dispense: 45 g; Refill: 1 ? ?Blood  pressure check ?WNL- Counseled on blood pressure goal of less than 120/80, low-sodium, DASH diet, 150 minutes of moderate intensity exercise per week. ? ? ?Patient have been counseled extensively about nutrition and exercise. Other issues discussed during this visit include: low cholesterol diet, weight control and daily exercise, foot care, annual eye examinations at Ophthalmology, importance of adherence with medications and regular follow-up. We also discussed long term complications of uncontrolled diabetes and hypertension.  ? ?Return in about 6 months (around 07/24/2022) for Bp/weight . ? ?The patient was given clear instructions to go to ER or return to medical center if symptoms don't improve, worsen or new problems develop. The patient verbalized understanding. The patient was told to call to get lab results if they haven't heard anything in the next week.  ? ?This note has been created with 07/26/2022. Any transcriptional errors are unintentional.  ? ?Education officer, environmental, NP ?01/22/2022, 1:37 PM  ?

## 2022-01-22 NOTE — Patient Instructions (Signed)

## 2022-07-24 ENCOUNTER — Ambulatory Visit (INDEPENDENT_AMBULATORY_CARE_PROVIDER_SITE_OTHER): Payer: Self-pay | Admitting: Primary Care

## 2022-10-11 IMAGING — MR MR LUMBAR SPINE W/O CM
4 of 5 series · 27 of 48 positions shown · non-contrast
Comparison: Lumbar spine radiographs 09/16/2021

CLINICAL DATA: Lumbar radiculopathy. Symptoms for greater than 6
weeks with conservative treatment. Patient reports low back pain and
left leg weakness for 5 months.

EXAM:
MRI LUMBAR SPINE WITHOUT CONTRAST
TECHNIQUE: Multiplanar, multisequence MR imaging of the lumbar spine was
performed. No intravenous contrast was administered.

[Series 3: T2 · sagittal · 4.0mm · 0.55mm/px · 6 of 15 slices shown (1 of 2)]
[im 1/15]
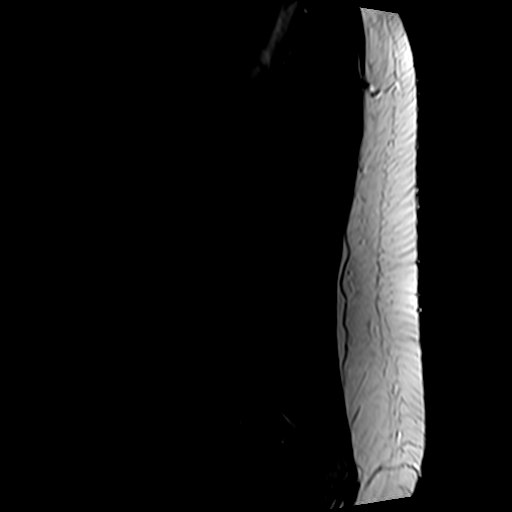
[im 3/15]
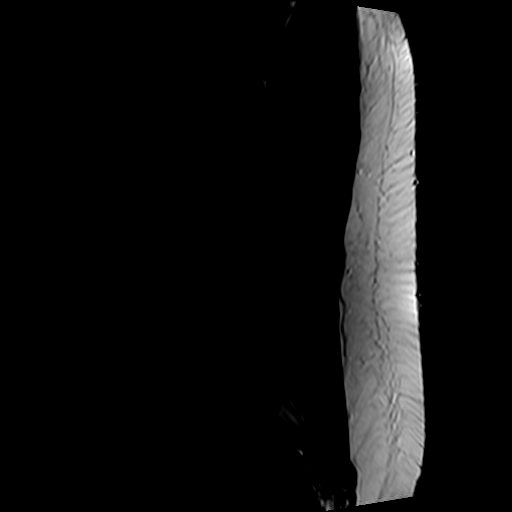
[im 6/15]
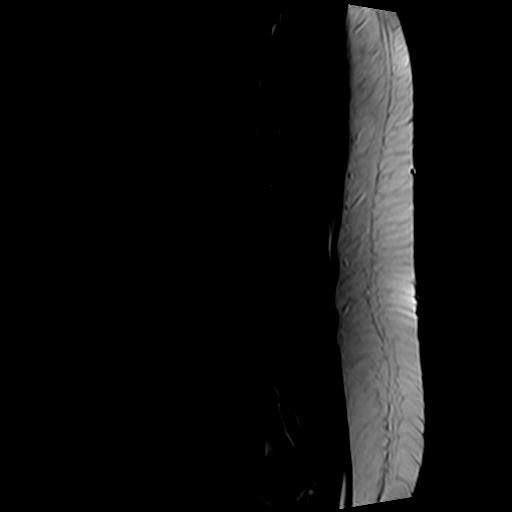
[im 9/15]
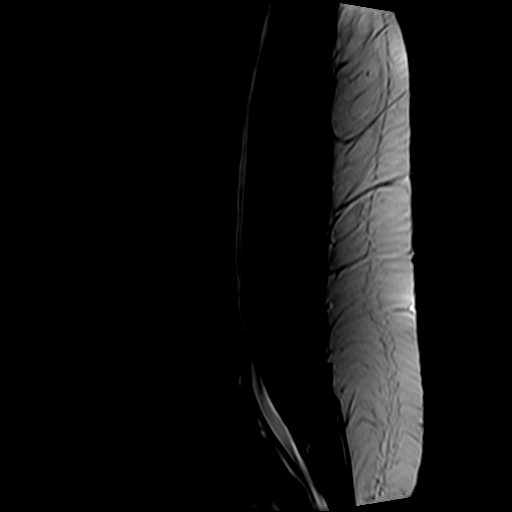
[im 12/15]
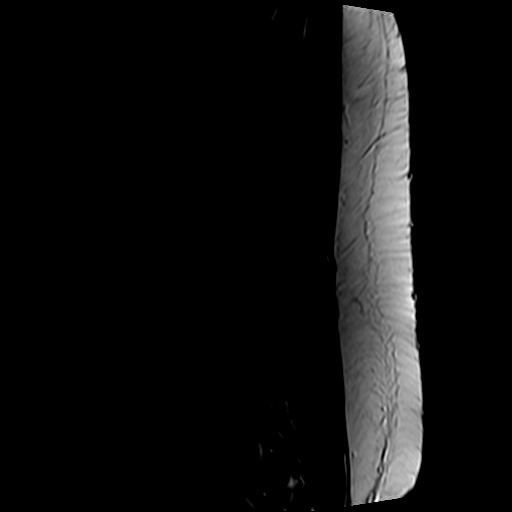
[im 15/15]
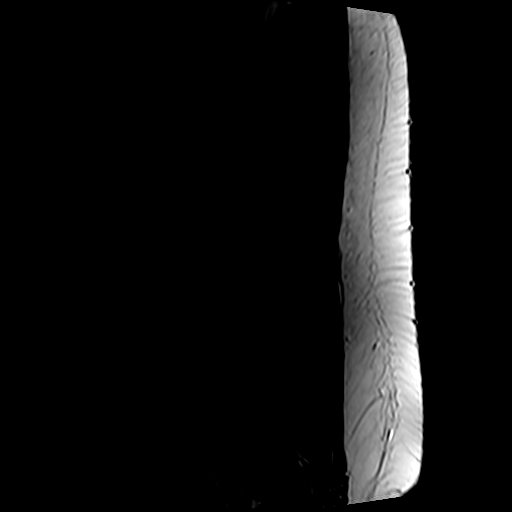

[Series 5: T2 · axial · 4.0mm · 0.70mm/px · z∈[-123,+126]mm · 10 of 49 slices shown (2 of 2)]
[im 4/49]
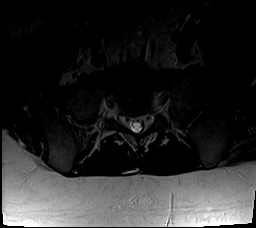
[im 7/49]
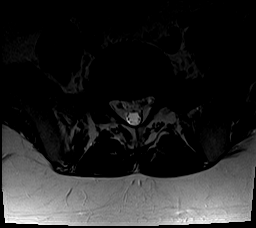
[im 10/49]
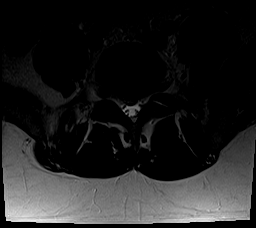
[im 17/49]
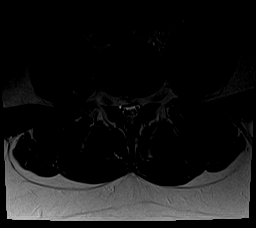
[im 23/49]
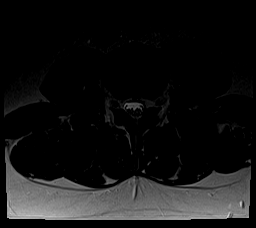
[im 26/49]
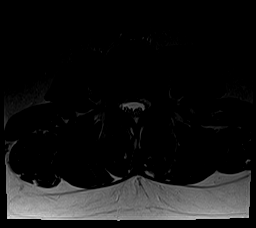
[im 29/49]
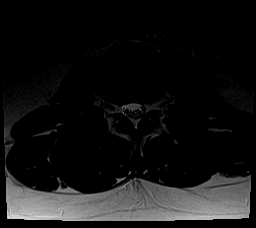
[im 36/49]
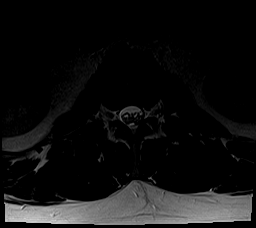
[im 42/49]
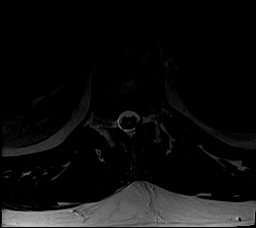
[im 49/49]
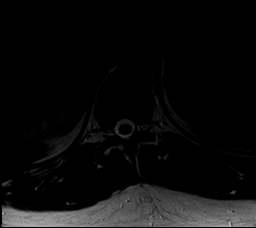

[Series 6: T1 · axial · 4.0mm · 0.35mm/px · z∈[-123,+89]mm · 8 of 49 slices shown (1 of 2)]
[im 4/49]
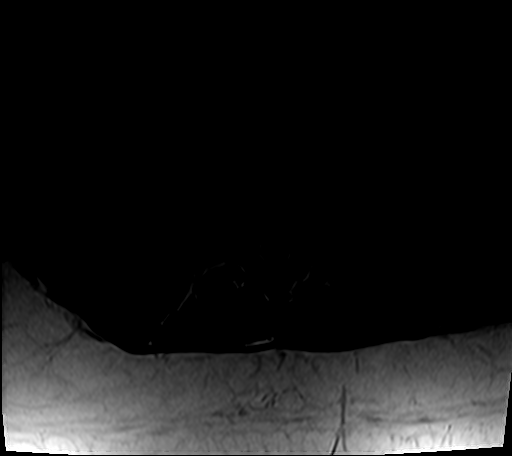
[im 7/49]
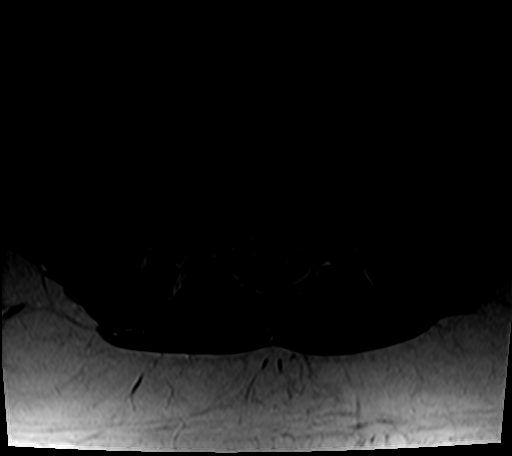
[im 10/49]
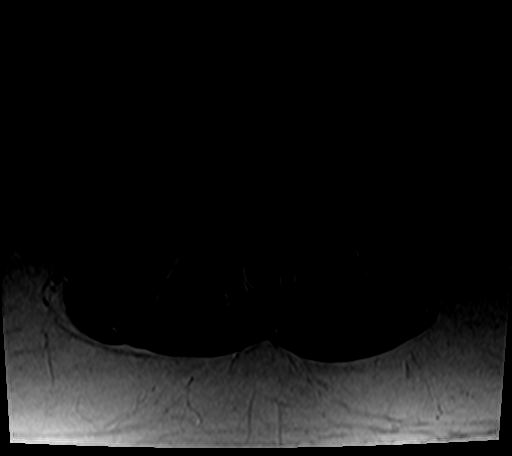
[im 17/49]
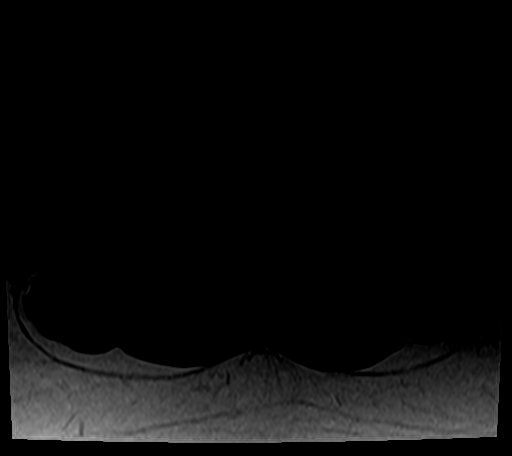
[im 23/49]
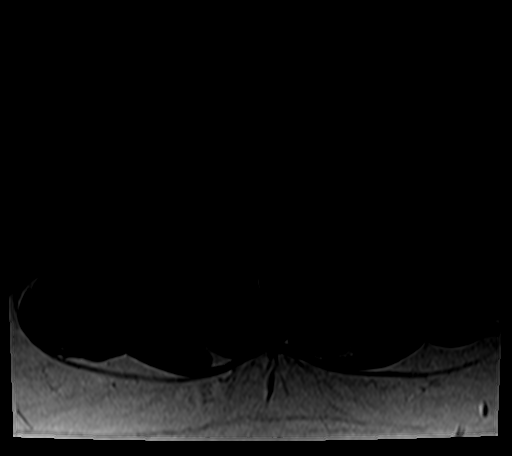
[im 26/49]
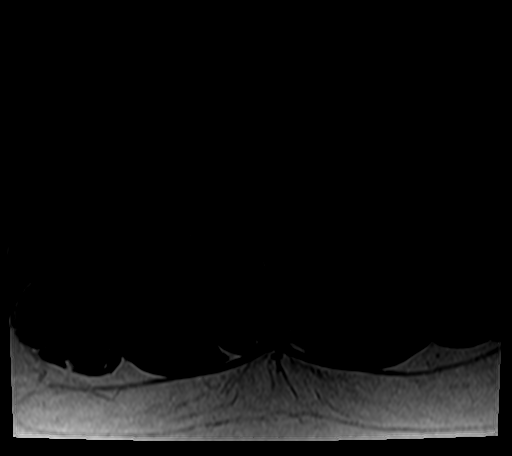
[im 29/49]
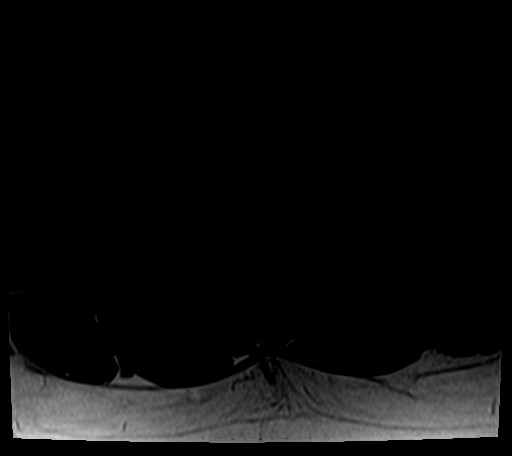
[im 42/49]
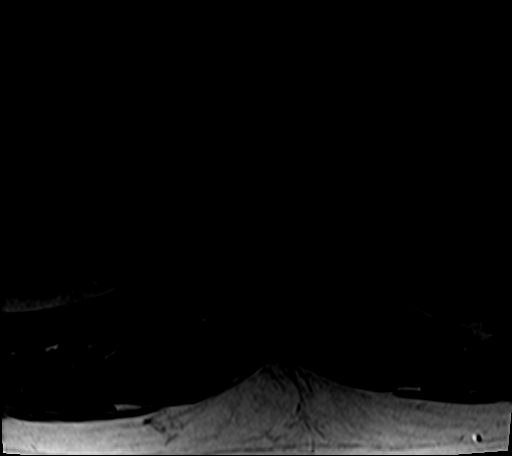

[Series 8: T1 · sagittal · 4.0mm · 0.55mm/px · 3 of 15 slices shown (2 of 2)]
[im 1/15]
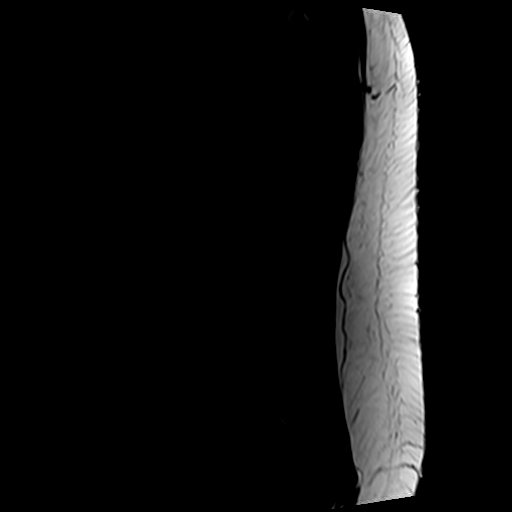
[im 8/15]
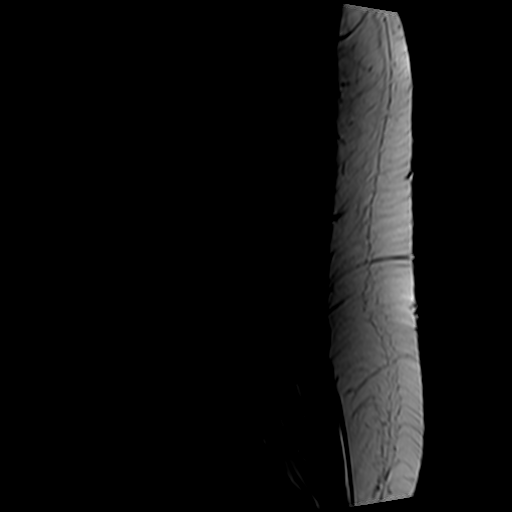
[im 15/15]
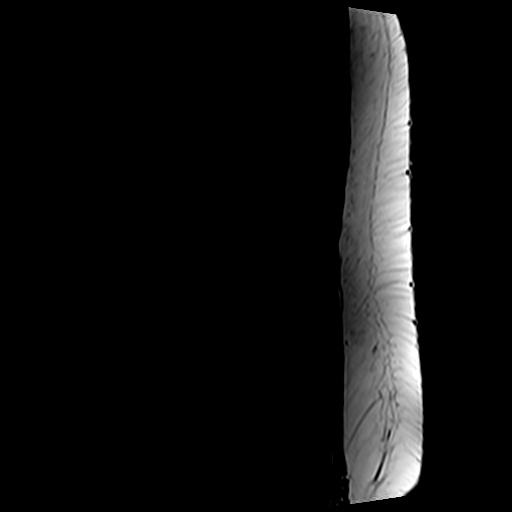

[27 of 48 positions shown; findings below may reference images not displayed]

FINDINGS: Segmentation: 5 non rib-bearing lumbar type vertebral bodies are
present. The lowest fully formed vertebral body is L5.

Alignment:  No significant listhesis is present.

Vertebrae:  Marrow signal and vertebral body heights are normal.

Conus medullaris and cauda equina: Conus extends to the L1 level.
Conus and cauda equina appear normal.

Paraspinal and other soft tissues: Limited imaging the abdomen is
unremarkable. There is no significant adenopathy. No solid organ
lesions are present.

Disc levels:

L1-2: Negative.

L2-3: Negative.

L3-4: Negative.

L4-5: A rightward disc protrusion is present. This leads to moderate
central canal stenosis with right greater than left subarticular and
lateral recess narrowing. Mild foraminal narrowing is present
bilaterally.

L5-S1: A more shallow central disc protrusion is present. The
protrusion potentially contacts the traversing L1 nerve roots
bilaterally. The foramina are patent.
IMPRESSION: 1. Moderate central canal stenosis with right greater than left
subarticular and lateral recess narrowing at L4-5 secondary to a
large rightward disc protrusion.
2. Mild foraminal narrowing bilaterally at L4-5.
3. More shallow central disc protrusion at L5-S1 with potential
contact of the traversing L1 nerve roots bilaterally.
